# Patient Record
Sex: Male | Born: 1990 | Race: White | Hispanic: No | Marital: Single | Smoking: Never smoker
Health system: Southern US, Community
[De-identification: ages and names within clinical notes are randomized; demographics above are authoritative.]

## PROBLEM LIST (undated history)

## (undated) HISTORY — PX: ANTERIOR CRUCIATE LIGAMENT REPAIR: SHX115

## (undated) HISTORY — PX: ADENOIDECTOMY: SUR15

---
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
## 2016-06-08 ENCOUNTER — Encounter (HOSPITAL_BASED_OUTPATIENT_CLINIC_OR_DEPARTMENT_OTHER): Payer: Self-pay | Admitting: Emergency Medicine

## 2016-06-08 ENCOUNTER — Emergency Department (HOSPITAL_BASED_OUTPATIENT_CLINIC_OR_DEPARTMENT_OTHER)
Admission: EM | Admit: 2016-06-08 | Discharge: 2016-06-08 | Disposition: A | Discharge status: Home / Self Care | Payer: BLUE CROSS/BLUE SHIELD | Attending: Emergency Medicine | Admitting: Emergency Medicine

## 2016-06-08 DIAGNOSIS — Z791 Long term (current) use of non-steroidal anti-inflammatories (NSAID): Secondary | ICD-10-CM | POA: Diagnosis not present

## 2016-06-08 DIAGNOSIS — K121 Other forms of stomatitis: Secondary | ICD-10-CM | POA: Diagnosis not present

## 2016-06-08 DIAGNOSIS — R509 Fever, unspecified: Secondary | ICD-10-CM | POA: Diagnosis not present

## 2016-06-08 DIAGNOSIS — Z79899 Other long term (current) drug therapy: Secondary | ICD-10-CM | POA: Insufficient documentation

## 2016-06-08 DIAGNOSIS — J029 Acute pharyngitis, unspecified: Secondary | ICD-10-CM | POA: Diagnosis present

## 2016-06-08 MED ORDER — OXYCODONE HCL 5 MG/5ML PO SOLN: 5.0000 mg | ORAL | 0 refills | Status: DC | PRN

## 2016-06-08 NOTE — ED Triage Notes (Signed)
Patient reports for the past 10 days he has had a sore throat. He had a fever initially, but that has resolved. Patient states he had sore in his mouth that have resolved, but now there is patches of pus on his mouth. Patient was [facility] by [facility], was given prednisone and "first mouthwash". Patient face is swollen. Patient is able to speak in complete sentences without difficulties.

## 2016-06-08 NOTE — Discharge Instructions (Signed)
Follow up with your family doc.  Return if unable to swallow.

## 2016-06-08 NOTE — ED Provider Notes (Signed)
[facility] Provider Note   CSN: [id number] Arrival date & time: 06/08/16  1804 By signing my name below, I, Randall Romero, attest that this documentation has been prepared under the direction and in the presence of Randall Plan, DO . Electronically Signed: Levon Romero, Scribe. 06/08/2016. 7:10 PM.   History   Chief Complaint Chief Complaint  Patient presents with  . Sore Throat    HPI Randall Romero is a 25 y.o. male who presents to the Emergency Department complaining of constant sore throat onset 10 days ago which significantly worsened yesterday. Pain is exacerbated by swallowing and eating. Per pt, he woke up two days ago with mouth sores. He states the sores blistered yesterday and have since begun to drain pus. Pt also notes associated fever which has since resolved, facial swelling, and cough secondary to drainage. He is tolerating secretions. [facility] and was given prednisone and First mouthwash which he has taken with no relief. No sick contact. Immunizations UTD. Pt denies any rash to his extremities, nausea, vomiting or any other symptoms.   The history is provided by the patient and a parent. No language interpreter was used.   History reviewed. No pertinent past medical history.  There are no active problems to display for this patient.   Past Surgical History:  Procedure Laterality Date  . ADENOIDECTOMY    . ANTERIOR CRUCIATE LIGAMENT REPAIR Right     Home Medications    Prior to Admission medications   Medication Sig Start Date End Date Taking? Authorizing Provider  DPH-Lido-AlHydr-MgHydr-Simeth [facility] BLM) SUSP Use as directed in the mouth or throat.   Yes Historical Provider, MD  ibuprofen (ADVIL,MOTRIN) 200 MG tablet Take 600 mg by mouth once.   Yes Historical Provider, MD  predniSONE (DELTASONE) 20 MG tablet Take 20 mg by mouth.   Yes Historical Provider, MD  Pseudoephedrine-APAP-DM (DAYQUIL PO) Take by  mouth.   Yes Historical Provider, MD  oxyCODONE (ROXICODONE) 5 MG/5ML solution Take 5 mLs (5 mg total) by mouth every 4 (four) hours as needed for severe pain. 06/08/16   Randall Plan, DO    Family History History reviewed. No pertinent family history.  Social History Social History  Substance Use Topics  . Smoking status: Never Smoker  . Smokeless tobacco: Never Used  . Alcohol use No     Comment: social     Allergies   Patient has no known allergies.   Review of Systems Review of Systems  Constitutional: Positive for fever (resolved). Negative for chills.  HENT: Positive for facial swelling, mouth sores and sore throat. Negative for congestion.   Eyes: Negative for discharge and visual disturbance.  Respiratory: Negative for shortness of breath.   Cardiovascular: Negative for chest pain and palpitations.  Gastrointestinal: Negative for abdominal pain, diarrhea, nausea and vomiting.  Musculoskeletal: Negative for arthralgias and myalgias.  Skin: Negative for color change and rash.  Neurological: Negative for tremors, syncope and headaches.  Psychiatric/Behavioral: Negative for confusion and dysphoric mood.   Physical Exam Updated Vital Signs BP 127/87 (BP Location: Left Arm)   Pulse 81   Temp 98.9 F (37.2 C) (Oral)   Resp 18   Ht 5\' 11"  (1.803 m)   Wt 171 lb 8 oz (77.8 kg)   SpO2 99%   BMI 23.92 kg/m   Physical Exam  Constitutional: He is oriented to person, place, and time. He appears well-developed and well-nourished.  HENT:  Head: Normocephalic and atraumatic.  Right Ear: Tympanic membrane normal.  Left Ear: Tympanic membrane normal.  Eroded ulcerations along the oropharynx. No swelling of the parotid gland. No signs of dental caries.    Eyes: EOM are normal. Pupils are equal, round, and reactive to light.  Neck: Normal range of motion. Neck supple. No JVD present.  Cardiovascular: Normal rate and regular rhythm.  Exam reveals no gallop and no friction rub.     No murmur heard. Pulmonary/Chest: No respiratory distress. He has no wheezes.  Abdominal: He exhibits no distension. There is no rebound and no guarding.  Musculoskeletal: Normal range of motion.  Neurological: He is alert and oriented to person, place, and time.  Skin: No rash noted. No pallor.  No rash to bilateral hands.   Psychiatric: He has a normal mood and affect. His behavior is normal.  Nursing note and vitals reviewed.  ED Treatments / Results  DIAGNOSTIC STUDIES:  Oxygen Saturation is 99% on RA, normal by my interpretation.    COORDINATION OF CARE:  7:03 PM Discussed treatment Romero with pt at bedside and pt agreed to Romero.   Labs (all labs ordered are listed, but only abnormal results are displayed) Labs Reviewed - No data to display  EKG  EKG Interpretation None       Radiology No results found.  Procedures Procedures (including critical care time)  Medications Ordered in ED Medications - No data to display   Initial Impression / Assessment and Romero / ED Course  I have reviewed the triage vital signs and the nursing notes.  Pertinent labs & imaging results that were available during my care of the patient were reviewed by me and considered in my medical decision making (see chart for details).  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 They going on for the past couple days. Has Magic mouthwash at home. Family is concerned because of the severity of symptoms. Patient appears well hydrated. Multiple eroded ulcerations to the gingiva in the posterior oropharynx. Tolerating his secretions well. No noted parotid tenderness. No lesions to the palms or soles. Discussed symptomatic therapy with the family. Offered HIV testing and declined. PCP follow-up.  11:19 PM:  I have discussed the diagnosis/risks/treatment options with the patient and family and believe the pt to be eligible for discharge home to follow-up with PCP. We also  discussed returning to the ED immediately if new or worsening sx occur. We discussed the sx which are most concerning (e.g., sudden worsening pain, fever, inability to tolerate by mouth) that necessitate immediate return. Medications administered to the patient during their visit and any new prescriptions provided to the patient are listed below.  Medications given during this visit Medications - No data to display   The patient appears reasonably screen and/or stabilized for discharge and I doubt any other medical condition or other [facility] requiring further screening, evaluation, or treatment in the ED at this time prior to discharge.    Final Clinical Impressions(s) / ED Diagnoses   Final diagnoses:  Stomatitis    New Prescriptions Discharge Medication List as of 06/08/2016  7:10 PM    START taking these medications   Details  oxyCODONE (ROXICODONE) 5 MG/5ML solution Take 5 mLs (5 mg total) by mouth every 4 (four) hours as needed for severe pain., Starting Wed 06/08/2016, Print       I personally performed the services described in this documentation, which was scribed in my presence. The recorded  information has been reviewed and is accurate.     Randall Planan Sharline Lehane, DO 06/08/16 2319

## 2016-06-09 ENCOUNTER — Ambulatory Visit: Payer: PRIVATE HEALTH INSURANCE | Admitting: Family Medicine

## 2016-06-11 ENCOUNTER — Encounter (HOSPITAL_BASED_OUTPATIENT_CLINIC_OR_DEPARTMENT_OTHER): Payer: Self-pay | Admitting: Emergency Medicine

## 2016-06-11 ENCOUNTER — Emergency Department (HOSPITAL_BASED_OUTPATIENT_CLINIC_OR_DEPARTMENT_OTHER)
Admission: EM | Admit: 2016-06-11 | Discharge: 2016-06-11 | Disposition: A | Discharge status: Home / Self Care | Payer: BLUE CROSS/BLUE SHIELD | Source: Home / Self Care | Attending: Emergency Medicine | Admitting: Emergency Medicine

## 2016-06-11 DIAGNOSIS — B002 Herpesviral gingivostomatitis and pharyngotonsillitis: Secondary | ICD-10-CM | POA: Diagnosis not present

## 2016-06-11 DIAGNOSIS — K120 Recurrent oral aphthae: Secondary | ICD-10-CM | POA: Insufficient documentation

## 2016-06-11 DIAGNOSIS — E860 Dehydration: Secondary | ICD-10-CM | POA: Diagnosis not present

## 2016-06-11 MED ORDER — SODIUM CHLORIDE 0.9 % IV BOLUS (SEPSIS)
1000.0000 mL | Freq: Once | INTRAVENOUS | Status: AC
  Administered 2016-06-11: 1000 mL via INTRAVENOUS

## 2016-06-11 MED ORDER — KETOROLAC TROMETHAMINE 15 MG/ML IJ SOLN
15.0000 mg | Freq: Once | INTRAMUSCULAR | Status: AC
  Administered 2016-06-11: 15 mg via INTRAVENOUS
  Filled 2016-06-11: qty 1

## 2016-06-11 MED ORDER — SUCRALFATE 1 GM/10ML PO SUSP: 1.0000 g | Freq: Three times a day (TID) | ORAL | 0 refills | Status: DC

## 2016-06-11 MED ORDER — MAGIC MOUTHWASH W/LIDOCAINE: 5.0000 mL | Freq: Four times a day (QID) | ORAL | 1 refills | Status: AC | PRN

## 2016-06-11 NOTE — ED Triage Notes (Signed)
Pt c/o sore throat, unable to swallow or drink water; seen here Wed for same

## 2016-06-11 NOTE — ED Provider Notes (Signed)
[facility] Provider Note   CSN: [id number] Arrival date & time: 06/11/16  [id number]     History   Chief Complaint Chief Complaint  Patient presents with  . Sore Throat    HPI Randall Romero is a 25 y.o. male.   Sore Throat    Pt comes in with cc of oral pain. Pt started feeling unwell about 5-6 days ago. On Wed he was seen in the ER, pt was having subjective fevers, and he was found to have oral ulcers. Pt [facility] [employer] [employer] [facility] [facility] prior to ER arrival, and was started on prednisone, mouth wash. Dr. Adela LankFloyd here had started pt on oral pain meds - but pt still has pan with swallowing, and his pain now affects the throat. Pt feels dehydrated. PT is taking ibuprofen with transient relief.  Pt denies any hx of STD, any high risk sexual encounter, any genital lesions, and autoimmune condition that runs in the family. Pt declined to be tested for HIV. Pt has pink eye on the L side, he thinks the vision is little blurry there. + tearing. Pt has some other URI like symptoms. No rash to the hands. Pt has no PCP.  History reviewed. No pertinent past medical history.  There are no active problems to display for this patient.   Past Surgical History:  Procedure Laterality Date  . ADENOIDECTOMY    . ANTERIOR CRUCIATE LIGAMENT REPAIR Right        Home Medications    Prior to Admission medications   Medication Sig Start Date End Date Taking? Authorizing Provider  DPH-Lido-AlHydr-MgHydr-Simeth [facility](FIRST-MOUTHWASH BLM) SUSP Use as directed in the mouth or throat.    Historical Provider, MD  ibuprofen (ADVIL,MOTRIN) 200 MG tablet Take 600 mg by mouth once.    Historical Provider, MD  oxyCODONE (ROXICODONE) 5 MG/5ML solution Take 5 mLs (5 mg total) by mouth every 4 (four) hours as needed for severe pain. 06/08/16   Melene Planan Floyd, DO  predniSONE (DELTASONE) 20 MG tablet Take 20 mg by mouth.    Historical Provider, MD  Pseudoephedrine-APAP-DM (DAYQUIL PO) Take by mouth.     Historical Provider, MD    Family History History reviewed. No pertinent family history.  Social History Social History  Substance Use Topics  . Smoking status: Never Smoker  . Smokeless tobacco: Never Used  . Alcohol use No     Comment: social     Allergies   Patient has no known allergies.   Review of Systems Review of Systems  ROS 10 Systems reviewed and are negative for acute change except as noted in the HPI.     Physical Exam Updated Vital Signs BP 136/90   Pulse 75   Temp 98.8 F (37.1 C) (Oral)   Resp 16   SpO2 100%   Physical Exam  Constitutional: He is oriented to person, place, and time. He appears well-developed.  HENT:  Head: Normocephalic and atraumatic.  Pt has excoriation of the lips, and of the oral mucosa, particularly the hard palate. The peritonsillar region has some cream color coating.  Pt has no trismus, stridor or crepitus over the neck. Pt has no tonsillar enlargement and exudates. Pharynx is erythematous. Base of the mouth is normal   Eyes: EOM are normal. Pupils are equal, round, and reactive to light.  Pink eye right side  Neck: Normal range of motion. Neck supple.  Cardiovascular: Normal rate and regular rhythm.   Pulmonary/Chest: Effort normal and breath sounds  normal.  Abdominal: Soft. Bowel sounds are normal.  Neurological: He is alert and oriented to person, place, and time.  Skin: Skin is warm.  Nursing note and vitals reviewed.    ED Treatments / Results  Labs (all labs ordered are listed, but only abnormal results are displayed) Labs Reviewed - No data to display  EKG  EKG Interpretation None       Radiology No results found.  Procedures Procedures (including critical care time)  Medications Ordered in ED Medications  sodium chloride 0.9 % bolus 1,000 mL (0 mLs Intravenous Stopped 06/11/16 1153)  ketorolac (TORADOL) 15 MG/ML injection 15 mg (15 mg Intravenous Given 06/11/16 1108)     Initial  Impression / Assessment and Plan / ED Course  I have reviewed the triage vital signs and the nursing notes.  Pertinent labs & imaging results that were available during my care of the patient were reviewed by me and considered in my medical decision making (see chart for details).  Clinical Course as of Jun 12 1243  Sat Jun 11, 2016  1237 Post iv fluids. Toradol helped, pain still not completely resolved.  Strict ER return precautions have been discussed, and patient is agreeing with the plan and is comfortable with the workup done and the recommendations from the ER.   [AN]  1242 Right eye 20/40 Left eye: 20/25 Binocular: 20/20  [AN]    Clinical Course User Index [AN] Derwood KaplanAnkit Tadarrius Burch, MD    Pt comes in with mouth pain. Pt is on appropriate meds - steroids/nsaids/norco/magic mouth wash. We will hydrate, as he feels dry. Throat exam reveals ?muco-purulent tissue vs. Granulation tissue over the healing ulcers.  ROS is neg for other system involvement - except the eye is red and ? Blurry vision. No pain over the eye. ROS and family hx is reassuring.  DDX: Apthous ulcer, HSV -1, HIV, auto-immune condition like Behcet's (due to eye involvement), and vasculitis are possible. [facility] student [facility] [employer], finished finals -so could be stress related. Doubt [facility]. No new meds or exposures prior to the symptoms.  We will give pt PCP f/u here. Continue with sx management. GI info will be given, but pt advised to call them only if the symptoms worsening and had gone more than 2 weeks.  Carafate added.  Final Clinical Impressions(s) / ED Diagnoses   Final diagnoses:  Oral aphthous ulcer    New Prescriptions New Prescriptions   No medications on file     Derwood KaplanAnkit Judianne Seiple, MD 06/11/16 1244

## 2016-06-11 NOTE — Discharge Instructions (Signed)
Follow up with one of the primary doctors that we have requested. Gi DOCTOR FOLLOW UP ONLY if not getting better beyond 2 weeks.  Please return to the ER if your symptoms worsen; you have increased pain, fevers, chills, inability to keep any medications down, spreading of the rash, skin sloughing off. Otherwise see the outpatient doctor as requested.

## 2016-06-12 ENCOUNTER — Inpatient Hospital Stay (HOSPITAL_BASED_OUTPATIENT_CLINIC_OR_DEPARTMENT_OTHER)
Admission: EM | Admit: 2016-06-12 | Discharge: 2016-06-16 | DRG: 159 | Disposition: A | Discharge status: Home / Self Care | Payer: BLUE CROSS/BLUE SHIELD | Attending: Family Medicine | Admitting: Family Medicine

## 2016-06-12 ENCOUNTER — Emergency Department (HOSPITAL_BASED_OUTPATIENT_CLINIC_OR_DEPARTMENT_OTHER): Payer: BLUE CROSS/BLUE SHIELD

## 2016-06-12 ENCOUNTER — Encounter (HOSPITAL_BASED_OUTPATIENT_CLINIC_OR_DEPARTMENT_OTHER): Payer: Self-pay | Admitting: Emergency Medicine

## 2016-06-12 DIAGNOSIS — Z79891 Long term (current) use of opiate analgesic: Secondary | ICD-10-CM

## 2016-06-12 DIAGNOSIS — J040 Acute laryngitis: Secondary | ICD-10-CM | POA: Diagnosis present

## 2016-06-12 DIAGNOSIS — R509 Fever, unspecified: Secondary | ICD-10-CM

## 2016-06-12 DIAGNOSIS — R0682 Tachypnea, not elsewhere classified: Secondary | ICD-10-CM | POA: Diagnosis present

## 2016-06-12 DIAGNOSIS — H538 Other visual disturbances: Secondary | ICD-10-CM | POA: Diagnosis present

## 2016-06-12 DIAGNOSIS — R000 Tachycardia, unspecified: Secondary | ICD-10-CM | POA: Diagnosis present

## 2016-06-12 DIAGNOSIS — J029 Acute pharyngitis, unspecified: Secondary | ICD-10-CM

## 2016-06-12 DIAGNOSIS — B002 Herpesviral gingivostomatitis and pharyngotonsillitis: Secondary | ICD-10-CM

## 2016-06-12 DIAGNOSIS — K121 Other forms of stomatitis: Secondary | ICD-10-CM

## 2016-06-12 DIAGNOSIS — E860 Dehydration: Secondary | ICD-10-CM | POA: Diagnosis present

## 2016-06-12 DIAGNOSIS — B9789 Other viral agents as the cause of diseases classified elsewhere: Secondary | ICD-10-CM | POA: Diagnosis present

## 2016-06-12 DIAGNOSIS — Z7952 Long term (current) use of systemic steroids: Secondary | ICD-10-CM

## 2016-06-12 DIAGNOSIS — B309 Viral conjunctivitis, unspecified: Secondary | ICD-10-CM | POA: Diagnosis present

## 2016-06-12 LAB — COMPREHENSIVE METABOLIC PANEL
ALK PHOS: 35 U/L — AB (ref 38–126)
ALT: 35 U/L (ref 17–63)
AST: 23 U/L (ref 15–41)
Albumin: 3.8 g/dL (ref 3.5–5.0)
Anion gap: 11 (ref 5–15)
BUN: 19 mg/dL (ref 6–20)
CALCIUM: 9.2 mg/dL (ref 8.9–10.3)
CO2: 26 mmol/L (ref 22–32)
CREATININE: 0.93 mg/dL (ref 0.61–1.24)
Chloride: 103 mmol/L (ref 101–111)
Glucose, Bld: 96 mg/dL (ref 65–99)
Potassium: 3.8 mmol/L (ref 3.5–5.1)
Sodium: 140 mmol/L (ref 135–145)
TOTAL PROTEIN: 7.6 g/dL (ref 6.5–8.1)
Total Bilirubin: 0.5 mg/dL (ref 0.3–1.2)

## 2016-06-12 LAB — MONONUCLEOSIS SCREEN: MONO SCREEN: NEGATIVE

## 2016-06-12 LAB — CBC WITH DIFFERENTIAL/PLATELET
Basophils Absolute: 0 10*3/uL (ref 0.0–0.1)
Basophils Relative: 0 %
EOS PCT: 2 %
Eosinophils Absolute: 0.2 10*3/uL (ref 0.0–0.7)
HCT: 43.1 % (ref 39.0–52.0)
HEMOGLOBIN: 15 g/dL (ref 13.0–17.0)
LYMPHS ABS: 1.9 10*3/uL (ref 0.7–4.0)
LYMPHS PCT: 20 %
MCH: 31 pg (ref 26.0–34.0)
MCHC: 34.8 g/dL (ref 30.0–36.0)
MCV: 89 fL (ref 78.0–100.0)
Monocytes Absolute: 1.1 10*3/uL — ABNORMAL HIGH (ref 0.1–1.0)
Monocytes Relative: 12 %
Neutro Abs: 6 10*3/uL (ref 1.7–7.7)
Neutrophils Relative %: 66 %
PLATELETS: 330 10*3/uL (ref 150–400)
RBC: 4.84 MIL/uL (ref 4.22–5.81)
RDW: 11.6 % (ref 11.5–15.5)
WBC: 9.1 10*3/uL (ref 4.0–10.5)

## 2016-06-12 LAB — RAPID STREP SCREEN (MED CTR MEBANE ONLY): Streptococcus, Group A Screen (Direct): NEGATIVE

## 2016-06-12 LAB — I-STAT CG4 LACTIC ACID, ED: LACTIC ACID, VENOUS: 0.97 mmol/L (ref 0.5–1.9)

## 2016-06-12 MED ORDER — HYDROMORPHONE HCL 1 MG/ML IJ SOLN
1.0000 mg | Freq: Once | INTRAMUSCULAR | Status: AC
  Administered 2016-06-12: 1 mg via INTRAVENOUS
  Filled 2016-06-12: qty 1

## 2016-06-12 MED ORDER — IBUPROFEN 800 MG PO TABS
800.0000 mg | ORAL_TABLET | Freq: Once | ORAL | Status: DC
  Filled 2016-06-12: qty 1

## 2016-06-12 MED ORDER — IBUPROFEN 100 MG/5ML PO SUSP
800.0000 mg | Freq: Once | ORAL | Status: AC
  Administered 2016-06-12: 800 mg via ORAL
  Filled 2016-06-12: qty 40

## 2016-06-12 MED ORDER — ACETAMINOPHEN 500 MG PO TABS
1000.0000 mg | ORAL_TABLET | Freq: Once | ORAL | Status: DC
  Filled 2016-06-12: qty 2

## 2016-06-12 MED ORDER — SODIUM CHLORIDE 0.9 % IV BOLUS (SEPSIS)
1000.0000 mL | Freq: Once | INTRAVENOUS | Status: AC
  Administered 2016-06-12: 1000 mL via INTRAVENOUS

## 2016-06-12 MED ORDER — MORPHINE SULFATE (PF) 4 MG/ML IV SOLN
4.0000 mg | Freq: Once | INTRAVENOUS | Status: AC
  Administered 2016-06-12: 4 mg via INTRAVENOUS
  Filled 2016-06-12: qty 1

## 2016-06-12 MED ORDER — SODIUM CHLORIDE 0.9 % IV BOLUS (SEPSIS)
500.0000 mL | Freq: Once | INTRAVENOUS | Status: AC
  Administered 2016-06-13: 500 mL via INTRAVENOUS

## 2016-06-12 MED ORDER — IOPAMIDOL (ISOVUE-300) INJECTION 61%
100.0000 mL | Freq: Once | INTRAVENOUS | Status: AC | PRN
  Administered 2016-06-12: 100 mL via INTRAVENOUS

## 2016-06-12 MED ORDER — ONDANSETRON HCL 4 MG/2ML IJ SOLN
4.0000 mg | Freq: Once | INTRAMUSCULAR | Status: AC
  Administered 2016-06-12: 4 mg via INTRAVENOUS
  Filled 2016-06-12: qty 2

## 2016-06-12 MED ORDER — ACETAMINOPHEN 160 MG/5ML PO SOLN
1000.0000 mg | Freq: Once | ORAL | Status: AC
  Administered 2016-06-12: 1000 mg via ORAL
  Filled 2016-06-12: qty 40.6

## 2016-06-12 MED ORDER — CLINDAMYCIN PHOSPHATE 900 MG/50ML IV SOLN
900.0000 mg | Freq: Once | INTRAVENOUS | Status: AC
  Administered 2016-06-12: 900 mg via INTRAVENOUS
  Filled 2016-06-12: qty 50

## 2016-06-12 NOTE — ED Notes (Signed)
EDP into room 

## 2016-06-12 NOTE — ED Provider Notes (Signed)
[facility]   CSN: [id number] Arrival date & time: 06/12/16  2141  By signing my name below, I, Arianna Nassar and Talbert Nanaul Grant, attest that this documentation has been prepared under the direction and in the presence of Jacalyn LefevreJulie Clorene Nerio, MD.  Electronically Signed: Octavia HeirArianna Nassar, ED Scribe. 06/12/16. 10:02 PM.   History   Chief Complaint Chief Complaint  Patient presents with  . Fever    HPI Randall Romero is a 25 y.o. male who presents to the Emergency Department complaining of gradual onset, gradual worsening sore throat that started one week ago. He states associated fever (tmax 103.1) and chills. He expresses increased pain with swallowing and is unable to keep fluids down. Pt [facility] where he was swabbed for thrush in which the results came back negative. He was further started on prednisone and given a mouthwash which did not give him any relief. [facility] and 12/16 for the same symptoms. He received oxycodone and diagnosed with an oral aphthous ulcer. According to pt's mother drank yogurt and mashed potatoes earlier today. Pt reports that he took ibuprofen (800 mg) x4 at 2000 tonight without relief. Mother denies diarrhea or abdominal pain.   The history is provided by the patient and a parent. No language interpreter was used.    History reviewed. No pertinent past medical history.  Patient Active Problem List   Diagnosis Date Noted  . Pharyngitis 06/13/2016    Past Surgical History:  Procedure Laterality Date  . ADENOIDECTOMY    . ANTERIOR CRUCIATE LIGAMENT REPAIR Right        Home Medications    Prior to Admission medications   Medication Sig Start Date End Date Taking? Authorizing Provider  DPH-Lido-AlHydr-MgHydr-Simeth [facility](FIRST-MOUTHWASH BLM) SUSP Use as directed in the mouth or throat.    Historical Provider, MD  ibuprofen (ADVIL,MOTRIN) 200 MG tablet Take 600 mg by mouth  once.    Historical Provider, MD  magic mouthwash w/lidocaine SOLN Take 5 mLs by mouth 4 (four) times daily as needed for mouth pain. 06/11/16   Derwood KaplanAnkit Nanavati, MD  oxyCODONE (ROXICODONE) 5 MG/5ML solution Take 5 mLs (5 mg total) by mouth every 4 (four) hours as needed for severe pain. 06/08/16   Melene Planan Floyd, DO  predniSONE (DELTASONE) 20 MG tablet Take 20 mg by mouth.    Historical Provider, MD  Pseudoephedrine-APAP-DM (DAYQUIL PO) Take by mouth.    Historical Provider, MD  sucralfate (CARAFATE) 1 GM/10ML suspension Take 10 mLs (1 g total) by mouth 4 (four) times daily -  with meals and at bedtime. 06/11/16   Derwood KaplanAnkit Nanavati, MD    Family History History reviewed. No pertinent family history.  Social History Social History  Substance Use Topics  . Smoking status: Never Smoker  . Smokeless tobacco: Never Used  . Alcohol use No     Comment: social     Allergies   Patient has no known allergies.   Review of Systems Review of Systems   A complete 10 system review of systems was obtained and all systems are negative except as noted in the HPI and PMH.    Physical Exam Updated Vital Signs BP 120/78   Pulse 78   Temp 98.1 F (36.7 C) (Oral)   Resp 18   Ht 5\' 11"  (1.803 m)   Wt 164 lb 2 oz (74.4 kg)   SpO2 100%   BMI 22.89  kg/m   Physical Exam  Constitutional: He is oriented to person, place, and time. He appears well-developed and well-nourished.  Appears ill and uncomfortable.  HENT:  Head: Normocephalic and atraumatic.  Mouth/Throat: Mucous membranes are dry.  Oral ulcers. erythematous oropharanyx.   Pt has excoriation of the lips, and of the oral mucosa, particularly the hard palate.   Eyes: EOM are normal.  Neck: Normal range of motion.  Cervical lymphadenopathy.  Cardiovascular: Regular rhythm, normal heart sounds and intact distal pulses.  Tachycardia present.   Tachycardia.  Pulmonary/Chest: Effort normal and breath sounds normal. Tachypnea noted. No  respiratory distress.  Tachypneic.  Abdominal: Soft. He exhibits no distension. There is no tenderness.  Musculoskeletal: Normal range of motion.  Neurological: He is alert and oriented to person, place, and time.  Skin: Skin is warm and dry.  Psychiatric: He has a normal mood and affect. Judgment normal.  Nursing note and vitals reviewed.    ED Treatments / Results   DIAGNOSTIC STUDIES: Oxygen Saturation is 97% on room air, adequate by my interpretation.  COORDINATION OF CARE: 10:02 PM Discussed treatment plan with pt at bedside and pt agreed to plan.   Labs (all labs ordered are listed, but only abnormal results are displayed) Labs Reviewed  CBC WITH DIFFERENTIAL/PLATELET - Abnormal; Notable for the following:       Result Value   Monocytes Absolute 1.1 (*)    All other components within normal limits  COMPREHENSIVE METABOLIC PANEL - Abnormal; Notable for the following:    Alkaline Phosphatase 35 (*)    All other components within normal limits  RAPID STREP SCREEN (NOT AT [facility])  CULTURE, BLOOD (ROUTINE X 2)  CULTURE, BLOOD (ROUTINE X 2)  CULTURE, GROUP A STREP ([facility])  MONONUCLEOSIS SCREEN  I-STAT CG4 LACTIC ACID, ED    EKG  EKG Interpretation None       Radiology Ct Soft Tissue Neck W Contrast  Result Date: 06/12/2016 CLINICAL DATA:  Gradual onset sore throat for 1 week. Fever, difficulty swallowing. Evaluate pharyngitis. History of adenoidectomy. EXAM: CT NECK WITH CONTRAST TECHNIQUE: Multidetector CT imaging of the neck was performed using the standard protocol following the bolus administration of intravenous contrast. CONTRAST:  ISOVUE-300 IOPAMIDOL (ISOVUE-300) INJECTION 61% COMPARISON:  None. FINDINGS: Pharynx and larynx: Trace retropharyngeal effusion. Mildly edematous hypopharynx, normal larynx. Airway is widely patent. Salivary glands: Normal. Thyroid: Normal. Lymph nodes: 11 mm short access level IIa lymph node, additional smaller scattered lymph  nodes. Vascular: 2 vessel aortic arch is a normal variant. Limited intracranial: Normal. Visualized orbits: Normal. Mastoids and visualized paranasal sinuses: Trace mucosal thickening without paranasal sinus air-fluid levels. Mastoid air cells are well aerated. Skeleton: Straightened cervical lordosis. No destructive bony lesions. Upper chest: Trace bronchial wall thickening in the included lung apices associated with bronchitis and reactive airway disease. Other: None. IMPRESSION: Mild pharyngitis and trace retropharyngeal effusion. Borderline reactive lymphadenopathy. Electronically Signed   By: Awilda Metro M.D.   On: 06/12/2016 23:48    Procedures Procedures (including critical care time)  Medications Ordered in ED Medications  sodium chloride 0.9 % bolus 1,000 mL (1,000 mLs Intravenous New Bag/Given 06/12/16 2235)  sodium chloride 0.9 % bolus 1,000 mL (1,000 mLs Intravenous New Bag/Given 06/12/16 2235)    And  sodium chloride 0.9 % bolus 500 mL (500 mLs Intravenous New Bag/Given 06/13/16 0003)  clindamycin (CLEOCIN) IVPB 900 mg (0 mg Intravenous Stopped 06/12/16 2310)  morphine 4 MG/ML injection 4 mg (4 mg Intravenous Given 06/12/16  2235)  ondansetron (ZOFRAN) injection 4 mg (4 mg Intravenous Given 06/12/16 2234)  iopamidol (ISOVUE-300) 61 % injection 100 mL (100 mLs Intravenous Contrast Given 06/12/16 2257)  acetaminophen (TYLENOL) solution 1,000 mg (1,000 mg Oral Given 06/12/16 2243)  ibuprofen (ADVIL,MOTRIN) 100 MG/5ML suspension 800 mg (800 mg Oral Given 06/12/16 2245)  HYDROmorphone (DILAUDID) injection 1 mg (1 mg Intravenous Given 06/12/16 2313)     Initial Impression / Assessment and Plan / ED Course  I have reviewed the triage vital signs and the nursing notes.  Pertinent labs & imaging results that were available during my care of the patient were reviewed by me and considered in my medical decision making (see chart for details).  Clinical Course    Pt is not  improving and has been to the ED multiple times and his throat still looks terrible.  His fever has improved with tylenol and ibuprofen.  HR improved with fever and fluids.  BP is normal.  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  The pt given 2500 cc NS.   I called a code sepsis, but it is unlikely that pt is septic.  Pt given a dose of clinda here in the ED.  Pt d/w Dr. Toniann FailKakrakandy who [facility]  Pt is stable for transfer.   Final Clinical Impressions(s) / ED Diagnoses   Final diagnoses:  Herpes stomatitis  Pharyngitis, unspecified etiology  Fever, unspecified fever cause  Dehydration   I personally performed the services described in this documentation, which was scribed in my presence. The recorded information has been reviewed and is accurate.    New Prescriptions New Prescriptions   No medications on file     Jacalyn LefevreJulie Genny Caulder, MD 06/13/16 0021

## 2016-06-12 NOTE — ED Triage Notes (Signed)
Pt c/o sore throat, unable to swallow or drink water; seen here Wed and yesterday for the same - motrin at 1500  - Patient has oxycodone at 8 pm  - patient reports that his fever is worse today and he has started to have chills and shivers

## 2016-06-13 ENCOUNTER — Encounter (HOSPITAL_COMMUNITY): Payer: Self-pay | Admitting: *Deleted

## 2016-06-13 DIAGNOSIS — H538 Other visual disturbances: Secondary | ICD-10-CM | POA: Diagnosis not present

## 2016-06-13 DIAGNOSIS — B9789 Other viral agents as the cause of diseases classified elsewhere: Secondary | ICD-10-CM | POA: Diagnosis not present

## 2016-06-13 DIAGNOSIS — R0682 Tachypnea, not elsewhere classified: Secondary | ICD-10-CM | POA: Diagnosis present

## 2016-06-13 DIAGNOSIS — Z79891 Long term (current) use of opiate analgesic: Secondary | ICD-10-CM | POA: Diagnosis not present

## 2016-06-13 DIAGNOSIS — J040 Acute laryngitis: Secondary | ICD-10-CM | POA: Diagnosis present

## 2016-06-13 DIAGNOSIS — B002 Herpesviral gingivostomatitis and pharyngotonsillitis: Secondary | ICD-10-CM | POA: Diagnosis present

## 2016-06-13 DIAGNOSIS — E860 Dehydration: Secondary | ICD-10-CM | POA: Diagnosis present

## 2016-06-13 DIAGNOSIS — K121 Other forms of stomatitis: Secondary | ICD-10-CM | POA: Diagnosis not present

## 2016-06-13 DIAGNOSIS — J029 Acute pharyngitis, unspecified: Secondary | ICD-10-CM | POA: Diagnosis not present

## 2016-06-13 DIAGNOSIS — Z7952 Long term (current) use of systemic steroids: Secondary | ICD-10-CM | POA: Diagnosis not present

## 2016-06-13 DIAGNOSIS — R000 Tachycardia, unspecified: Secondary | ICD-10-CM | POA: Diagnosis present

## 2016-06-13 DIAGNOSIS — B309 Viral conjunctivitis, unspecified: Secondary | ICD-10-CM | POA: Diagnosis present

## 2016-06-13 MED ORDER — PREDNISONE 20 MG PO TABS: 20.0000 mg | ORAL_TABLET | Freq: Every day | ORAL | Status: DC

## 2016-06-13 MED ORDER — DEXTROSE 5 % IV SOLN
5.0000 mg/kg | Freq: Three times a day (TID) | INTRAVENOUS | Status: DC
  Administered 2016-06-13 – 2016-06-14 (×4): 370 mg via INTRAVENOUS
  Filled 2016-06-13 (×5): qty 7.4

## 2016-06-13 MED ORDER — DOCUSATE SODIUM 50 MG/5ML PO LIQD
200.0000 mg | Freq: Two times a day (BID) | ORAL | Status: DC
  Administered 2016-06-13 – 2016-06-14 (×2): 200 mg via ORAL
  Filled 2016-06-13 (×3): qty 20

## 2016-06-13 MED ORDER — DEXTROSE 5 % IV SOLN
10.0000 mg/kg | Freq: Three times a day (TID) | INTRAVENOUS | Status: DC
  Filled 2016-06-13 (×2): qty 14.9

## 2016-06-13 MED ORDER — ACETAMINOPHEN 160 MG/5ML PO SOLN
650.0000 mg | ORAL | Status: DC | PRN
  Administered 2016-06-13 – 2016-06-14 (×5): 650 mg via ORAL
  Filled 2016-06-13 (×5): qty 20.3

## 2016-06-13 MED ORDER — SODIUM CHLORIDE 0.9 % IV SOLN
INTRAVENOUS | Status: DC
  Administered 2016-06-13 – 2016-06-15 (×6): via INTRAVENOUS

## 2016-06-13 MED ORDER — IBUPROFEN 600 MG PO TABS
600.0000 mg | ORAL_TABLET | Freq: Once | ORAL | Status: AC
  Administered 2016-06-13: 600 mg via ORAL
  Filled 2016-06-13: qty 1

## 2016-06-13 MED ORDER — DEXTROSE 5 % IV SOLN
5.0000 mg/kg | Freq: Three times a day (TID) | INTRAVENOUS | Status: DC
  Filled 2016-06-13 (×2): qty 7.4

## 2016-06-13 MED ORDER — ENOXAPARIN SODIUM 40 MG/0.4ML ~~LOC~~ SOLN
40.0000 mg | SUBCUTANEOUS | Status: DC
  Filled 2016-06-13: qty 0.4

## 2016-06-13 MED ORDER — HYDROMORPHONE HCL 1 MG/ML IJ SOLN
1.0000 mg | Freq: Once | INTRAMUSCULAR | Status: AC
  Administered 2016-06-13: 1 mg via INTRAVENOUS
  Filled 2016-06-13: qty 1

## 2016-06-13 MED ORDER — CLINDAMYCIN PHOSPHATE 600 MG/50ML IV SOLN
600.0000 mg | Freq: Three times a day (TID) | INTRAVENOUS | Status: DC
  Administered 2016-06-13 – 2016-06-14 (×4): 600 mg via INTRAVENOUS
  Filled 2016-06-13 (×5): qty 50

## 2016-06-13 MED ORDER — GATIFLOXACIN 0.5 % OP SOLN
1.0000 [drp] | Freq: Four times a day (QID) | OPHTHALMIC | Status: DC
  Administered 2016-06-13 – 2016-06-16 (×14): 1 [drp] via OPHTHALMIC
  Filled 2016-06-13: qty 2.5

## 2016-06-13 MED ORDER — SODIUM CHLORIDE 0.9 % IV SOLN
INTRAVENOUS | Status: AC
  Administered 2016-06-13: 04:00:00 via INTRAVENOUS

## 2016-06-13 MED ORDER — WHITE PETROLATUM GEL
Status: AC
  Filled 2016-06-13: qty 1

## 2016-06-13 MED ORDER — OXYCODONE HCL 5 MG PO TABS
5.0000 mg | ORAL_TABLET | ORAL | Status: DC | PRN
  Administered 2016-06-13 – 2016-06-16 (×14): 5 mg via ORAL
  Filled 2016-06-13 (×14): qty 1

## 2016-06-13 MED ORDER — DEXTROSE 5 % IV SOLN: 5.0000 mg/kg | Freq: Three times a day (TID) | INTRAVENOUS | Status: DC

## 2016-06-13 MED ORDER — MAGIC MOUTHWASH W/LIDOCAINE
5.0000 mL | Freq: Four times a day (QID) | ORAL | Status: DC | PRN
  Administered 2016-06-13 (×2): 5 mL via ORAL
  Filled 2016-06-13 (×3): qty 5

## 2016-06-13 MED ORDER — SUCRALFATE 1 GM/10ML PO SUSP
1.0000 g | Freq: Three times a day (TID) | ORAL | Status: DC
  Administered 2016-06-13 – 2016-06-16 (×15): 1 g via ORAL
  Filled 2016-06-13 (×15): qty 10

## 2016-06-13 MED ORDER — HYDROMORPHONE HCL 2 MG/ML IJ SOLN
0.5000 mg | INTRAMUSCULAR | Status: AC | PRN
  Administered 2016-06-13 – 2016-06-14 (×5): 0.5 mg via INTRAVENOUS
  Filled 2016-06-13 (×5): qty 1

## 2016-06-13 NOTE — Progress Notes (Signed)
06/12/2016 10:01 PM  06/13/2016 5:41 PM  Randall Romero was seen and examined.  The H&P by the admitting provider , orders, imaging was reviewed.  I spoke with opthalmologist Dr. Dione BoozeGroat who said that when patient can take p.o. Put him on valtrex 500 TID and have him to follow up in office for slit eye exam.  I also placed consult to ENT.   Please see orders.  Will continue to follow.   Maryln Manuel. Johnson, MD [facility]  [phone]

## 2016-06-13 NOTE — Progress Notes (Addendum)
Pharmacy Antibiotic Note  Randall DawleyJoseph M Davie is a 25 y.o. 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 Noted that pt with issues swallowing - hasn't taken any po meds since came to ED due to pain. Pharmacy has been consulted for Acyclovir dosing.  Plan: Acyclovir 370mg  (5mg /kg) IV q8h Will f/u micro data, renal function, and pt's clinical condition Change to oral as soon as pt can tolerate  Height: 5\' 11"  (180.3 cm) Weight: 164 lb 2 oz (74.4 kg) IBW/kg (Calculated) : 75.3  Temp (24hrs), Avg:99.4 F (37.4 C), Min:97.5 F (36.4 C), Max:103.1 F (39.5 C)   Recent Labs Lab 06/12/16 2213 06/12/16 2220  WBC 9.1  --   CREATININE 0.93  --   LATICACIDVEN  --  0.97    Estimated Creatinine Clearance: 130 mL/min (by C-G formula based on SCr of 0.93 mg/dL).    No Known Allergies  Antimicrobials this admission: 12/17 Clinda x 1 12/18 Acyclovir >>   Dose adjustments this admission: n/a  Microbiology results: 12/17 BCx x2:  12/17 Rapid strep: negative   Thank you for allowing pharmacy to be a part of this patient's care.  Christoper Fabianaron Miaya Lafontant, PharmD, BCPS Clinical pharmacist, pager [phone] 06/13/2016 3:12 AM

## 2016-06-13 NOTE — Progress Notes (Signed)
Patient states that Dr Pollyann Kennedyrosen put in an order for Dilaudid. [facility], Provider is in surgery, OR says provider not there yet. Called cell number and left message.  12:57 Not able to get a hold of dr Pollyann Kennedyrosen. At fathers request I text paged attending.  Casper HarrisonSamantha K Judeth Gilles, RN

## 2016-06-13 NOTE — Care Management Note (Signed)
Case Management Note  Patient Details  Name: Annamarie DawleyJoseph M Sher MRN: [id number] Date of Birth: 04/27/1991  Subjective/Objective:                 Patient in obs for pharyngitis, poor PO. Student, independent PTA.    Action/Plan:  CM will continue to follow for DC planning.   Expected Discharge Date:                  Expected Discharge Plan:  Home/Self Care  In-House Referral:     Discharge planning Services  CM Consult  Post Acute Care Choice:    Choice offered to:     DME Arranged:    DME Agency:     HH Arranged:    HH Agency:     Status of Service:  In process, will continue to follow  If discussed at Long Length of Stay Meetings, dates discussed:    Additional Comments:  Lawerance SabalDebbie Kenya Shiraishi, RN 06/13/2016, 4:14 PM

## 2016-06-13 NOTE — H&P (Signed)
History and Physical    Randall DawleyJoseph M Swim ZOX:[id number] DOB: [birth year] DOA: 06/12/2016   PCP: No PCP Per Patient Chief Complaint:  Chief Complaint  Patient presents with  . Fever    HPI: Randall Romero is a 25 y.o. male who [facility]  gradual onset, gradual worsening sore throat that started one week ago. He states associated fever (tmax 103.1) and chills. He expresses increased pain with swallowing and is unable to keep fluids down. [facility] [place] [facility] he was swabbed for thrush in which the results came back negative. He was further started on prednisone and given a mouthwash which did not give him any relief. [facility] for the same symptoms. He received oxycodone and diagnosed with an oral aphthous ulcer. According to pt's mother drank yogurt and mashed potatoes earlier today. Pt reports that he took ibuprofen (800 mg) x4 at 2000 tonight without relief. Mother denies diarrhea or abdominal pain.  ED Course: Patient also has R eye blurry vision and irritation and erythema to conjunctiva.  This has been ongoing since Wed.  Review of Systems: As per HPI otherwise 10 point review of systems negative.    History reviewed. No pertinent past medical history.  Past Surgical History:  Procedure Laterality Date  . ADENOIDECTOMY    . ANTERIOR CRUCIATE LIGAMENT REPAIR Right      reports that he has never smoked. He has never used smokeless tobacco. He reports that he does not drink alcohol or use drugs.  No Known Allergies  History reviewed. No pertinent family history.    Prior to Admission medications   Medication Sig Start Date End Date Taking? Authorizing Provider  [facility](FIRST-MOUTHWASH BLM) SUSP Use as directed in the mouth or throat.    Historical Provider, MD  ibuprofen (ADVIL,MOTRIN) 200 MG tablet Take 600 mg by mouth once.    Historical Provider, MD  magic mouthwash  w/lidocaine SOLN Take 5 mLs by mouth 4 (four) times daily as needed for mouth pain. 06/11/16   Derwood KaplanAnkit Nanavati, MD  oxyCODONE (ROXICODONE) 5 MG/5ML solution Take 5 mLs (5 mg total) by mouth every 4 (four) hours as needed for severe pain. 06/08/16   Melene Planan Floyd, DO  predniSONE (DELTASONE) 20 MG tablet Take 20 mg by mouth.    Historical Provider, MD  Pseudoephedrine-APAP-DM (DAYQUIL PO) Take by mouth.    Historical Provider, MD  sucralfate (CARAFATE) 1 GM/10ML suspension Take 10 mLs (1 g total) by mouth 4 (four) times daily -  with meals and at bedtime. 06/11/16   Derwood KaplanAnkit Nanavati, MD    Physical Exam: Vitals:   06/13/16 0115 06/13/16 0130 06/13/16 0131 06/13/16 0228  BP: 118/83 118/79  117/81  Pulse: 83 76  73  Resp: 17 19  18   Temp:   97.5 F (36.4 C) 99 F (37.2 C)  TempSrc:   Oral Oral  SpO2: 98% 99%  99%  Weight:      Height:          Constitutional: NAD, calm, comfortable Eyes: PERRL, conjunctivitis of the R eye, I am unable to do an exam with staining for HSV keratitis. ENMT: Oral ulcers, excoriation of lips, dry mucous membranes, posterior erythema of oropharynx. Neck: normal, supple, no masses, no thyromegaly Respiratory: clear to auscultation bilaterally, no wheezing, no crackles. Normal respiratory effort. No accessory muscle use.  Cardiovascular: Regular rate and rhythm, no murmurs / rubs /  gallops. No extremity edema. 2+ pedal pulses. No carotid bruits.  Abdomen: no tenderness, no masses palpated. No hepatosplenomegaly. Bowel sounds positive.  Musculoskeletal: no clubbing / cyanosis. No joint deformity upper and lower extremities. Good ROM, no contractures. Normal muscle tone.  Skin: no rashes, lesions, ulcers. No induration Neurologic: CN 2-12 grossly intact. Sensation intact, DTR normal. Strength 5/5 in all 4.  Psychiatric: Normal judgment and insight. Alert and oriented x 3. Normal mood.    Labs on Admission: I have personally reviewed following labs and imaging  studies  CBC:  Recent Labs Lab 06/12/16 2213  WBC 9.1  NEUTROABS 6.0  HGB 15.0  HCT 43.1  MCV 89.0  PLT 330   Basic Metabolic Panel:  Recent Labs Lab 06/12/16 2213  NA 140  K 3.8  CL 103  CO2 26  GLUCOSE 96  BUN 19  CREATININE 0.93  CALCIUM 9.2   GFR: Estimated Creatinine Clearance: 130 mL/min (by C-G formula based on SCr of 0.93 mg/dL). Liver Function Tests:  Recent Labs Lab 06/12/16 2213  AST 23  ALT 35  ALKPHOS 35*  BILITOT 0.5  PROT 7.6  ALBUMIN 3.8   No results for input(s): LIPASE, AMYLASE in the last 168 hours. No results for input(s): AMMONIA in the last 168 hours. Coagulation Profile: No results for input(s): INR, PROTIME in the last 168 hours. Cardiac Enzymes: No results for input(s): CKTOTAL, CKMB, CKMBINDEX, TROPONINI in the last 168 hours. BNP (last 3 results) No results for input(s): PROBNP in the last 8760 hours. HbA1C: No results for input(s): HGBA1C in the last 72 hours. CBG: No results for input(s): GLUCAP in the last 168 hours. Lipid Profile: No results for input(s): CHOL, HDL, LDLCALC, TRIG, CHOLHDL, LDLDIRECT in the last 72 hours. Thyroid Function Tests: No results for input(s): TSH, T4TOTAL, FREET4, T3FREE, THYROIDAB in the last 72 hours. Anemia Panel: No results for input(s): VITAMINB12, FOLATE, FERRITIN, TIBC, IRON, RETICCTPCT in the last 72 hours. Urine analysis: No results found for: COLORURINE, APPEARANCEUR, LABSPEC, PHURINE, GLUCOSEU, HGBUR, BILIRUBINUR, KETONESUR, PROTEINUR, UROBILINOGEN, NITRITE, LEUKOCYTESUR Sepsis Labs: @LABRCNTIP (procalcitonin:4,lacticidven:4) ) Recent Results (from the past 240 hour(s))  Rapid strep screen     Status: None   Collection Time: 06/12/16 10:29 PM  Result Value Ref Range Status   Streptococcus, Group A Screen (Direct) NEGATIVE NEGATIVE Final    Comment: (NOTE) A Rapid Antigen test may result negative if the antigen level in the sample is below the detection level of this test. The  FDA has not cleared this test as a stand-alone test therefore the rapid antigen negative result has reflexed to a Group A Strep culture.      Radiological Exams on Admission: Ct Soft Tissue Neck W Contrast  Result Date: 06/12/2016 CLINICAL DATA:  Gradual onset sore throat for 1 week. Fever, difficulty swallowing. Evaluate pharyngitis. History of adenoidectomy. EXAM: CT NECK WITH CONTRAST TECHNIQUE: Multidetector CT imaging of the neck was performed using the standard protocol following the bolus administration of intravenous contrast. CONTRAST:  100mL ISOVUE-300 IOPAMIDOL (ISOVUE-300) INJECTION 61% COMPARISON:  None. FINDINGS: Pharynx and larynx: Trace retropharyngeal effusion. Mildly edematous hypopharynx, normal larynx. Airway is widely patent. Salivary glands: Normal. Thyroid: Normal. Lymph nodes: 11 mm short access level IIa lymph node, additional smaller scattered lymph nodes. Vascular: 2 vessel aortic arch is a normal variant. Limited intracranial: Normal. Visualized orbits: Normal. Mastoids and visualized paranasal sinuses: Trace mucosal thickening without paranasal sinus air-fluid levels. Mastoid air cells are well aerated. Skeleton: Straightened cervical lordosis. No destructive  bony lesions. Upper chest: Trace bronchial wall thickening in the included lung apices associated with bronchitis and reactive airway disease. Other: None. IMPRESSION: Mild pharyngitis and trace retropharyngeal effusion. Borderline reactive lymphadenopathy. Electronically Signed   By: Awilda Metro M.D.   On: 06/12/2016 23:48    EKG: Independently reviewed.  Assessment/Plan Principal Problem:   Pharyngitis Active Problems:   Blurry vision    1. Pharyngitis - 1. 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 Empiric clindamycin for bacterial infection 3. Empiric acyclovir for herpes stomatitis, especially since concern now exists for possible keratitis (see below) 4. HSV-1 PCR ordered  on oral swab (RN calling down to lab to find out how to do this). 5. Rapid strep negative, culture pending 2. Blurry vision and conjunctivitis - 1. Empiric gatifloxacin eye drops for now 2. Given blurry vision, need to rule out HSV keratitis 3. Call Opthomology in AM for eye exam to rule this in or out 4. Until then, I have discussed with pharmacy and patient getting put on empiric acyclovir.   DVT prophylaxis: Lovenox Code Status: Full Family Communication: Mother at bedside Consults called: None Admission status: Admit to obs   GARDNER, Heywood Iles DO [facility] Pager [phone] from 7PM-7AM  If 7AM-7PM, please contact the day physician for the patient [email or web address] Password [email or web address]  06/13/2016, 4:10 AM

## 2016-06-13 NOTE — Consult Note (Signed)
Reason for Consult: Laryngitis, oral stomatitis Referring Physician: Murlean Iba, MD  Randall Romero is an 25 y.o. male.  HPI: Previously healthy, last Monday developed intense sore throat and blisters in his mouth. 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 He went to the clinic at the school a couple of times. He has not been on antibiotics recently. The week prior to this, he was having upper respiratory symptoms. No prior history of oral or pharyngeal problems. He does not smoke.  History reviewed. No pertinent past medical history.  Past Surgical History:  Procedure Laterality Date  . ADENOIDECTOMY    . ANTERIOR CRUCIATE LIGAMENT REPAIR Right     History reviewed. No pertinent family history.  Social History:  reports that he has never smoked. He has never used smokeless tobacco. He reports that he does not drink alcohol or use drugs.  Allergies: No Known Allergies  Medications: Reviewed  Results for orders placed or performed during the hospital encounter of 06/12/16 (from the past 48 hour(s))  CBC with Differential     Status: Abnormal   Collection Time: 06/12/16 10:13 PM  Result Value Ref Range   WBC 9.1 4.0 - 10.5 K/uL   RBC 4.84 4.22 - 5.81 MIL/uL   Hemoglobin 15.0 13.0 - 17.0 g/dL   HCT 43.1 39.0 - 52.0 %   MCV 89.0 78.0 - 100.0 fL   MCH 31.0 26.0 - 34.0 pg   MCHC 34.8 30.0 - 36.0 g/dL   RDW 11.6 11.5 - 15.5 %   Platelets 330 150 - 400 K/uL   Neutrophils Relative % 66 %   Neutro Abs 6.0 1.7 - 7.7 K/uL   Lymphocytes Relative 20 %   Lymphs Abs 1.9 0.7 - 4.0 K/uL   Monocytes Relative 12 %   Monocytes Absolute 1.1 (H) 0.1 - 1.0 K/uL   Eosinophils Relative 2 %   Eosinophils Absolute 0.2 0.0 - 0.7 K/uL   Basophils Relative 0 %   Basophils Absolute 0.0 0.0 - 0.1 K/uL  Comprehensive metabolic panel     Status: Abnormal   Collection Time: 06/12/16 10:13 PM  Result Value Ref Range   Sodium 140 135 - 145 mmol/L   Potassium 3.8 3.5 - 5.1 mmol/L   Chloride 103  101 - 111 mmol/L   CO2 26 22 - 32 mmol/L   Glucose, Bld 96 65 - 99 mg/dL   BUN 19 6 - 20 mg/dL   Creatinine, Ser 0.93 0.61 - 1.24 mg/dL   Calcium 9.2 8.9 - 10.3 mg/dL   Total Protein 7.6 6.5 - 8.1 g/dL   Albumin 3.8 3.5 - 5.0 g/dL   AST 23 15 - 41 U/L   ALT 35 17 - 63 U/L   Alkaline Phosphatase 35 (L) 38 - 126 U/L   Total Bilirubin 0.5 0.3 - 1.2 mg/dL   GFR calc non Af Amer >60 >60 mL/min   GFR calc Af Amer >60 >60 mL/min    Comment: (NOTE) The eGFR has been calculated using the CKD EPI equation. This calculation has not been validated in all clinical situations. eGFR's persistently <60 mL/min signify possible Chronic Kidney Disease.    Anion gap 11 5 - 15  Mononucleosis screen     Status: None   Collection Time: 06/12/16 10:13 PM  Result Value Ref Range   Mono Screen NEGATIVE NEGATIVE  I-Stat CG4 Lactic Acid, ED  (not at  [facility])     Status: None   Collection Time: 06/12/16  10:20 PM  Result Value Ref Range   Lactic Acid, Venous 0.97 0.5 - 1.9 mmol/L  Rapid strep screen     Status: None   Collection Time: 06/12/16 10:29 PM  Result Value Ref Range   Streptococcus, Group A Screen (Direct) NEGATIVE NEGATIVE    Comment: (NOTE) A Rapid Antigen test may result negative if the antigen level in the sample is below the detection level of this test. The FDA has not cleared this test as a stand-alone test therefore the rapid antigen negative result has reflexed to a Group A Strep culture.     Ct Soft Tissue Neck W Contrast  Result Date: 06/12/2016 CLINICAL DATA:  Gradual onset sore throat for 1 week. Fever, difficulty swallowing. Evaluate pharyngitis. History of adenoidectomy. EXAM: CT NECK WITH CONTRAST TECHNIQUE: Multidetector CT imaging of the neck was performed using the standard protocol following the bolus administration of intravenous contrast. CONTRAST:  158m ISOVUE-300 IOPAMIDOL (ISOVUE-300) INJECTION 61% COMPARISON:  None. FINDINGS: Pharynx and larynx: Trace retropharyngeal  effusion. Mildly edematous hypopharynx, normal larynx. Airway is widely patent. Salivary glands: Normal. Thyroid: Normal. Lymph nodes: 11 mm short access level IIa lymph node, additional smaller scattered lymph nodes. Vascular: 2 vessel aortic arch is a normal variant. Limited intracranial: Normal. Visualized orbits: Normal. Mastoids and visualized paranasal sinuses: Trace mucosal thickening without paranasal sinus air-fluid levels. Mastoid air cells are well aerated. Skeleton: Straightened cervical lordosis. No destructive bony lesions. Upper chest: Trace bronchial wall thickening in the included lung apices associated with bronchitis and reactive airway disease. Other: None. IMPRESSION: Mild pharyngitis and trace retropharyngeal effusion. Borderline reactive lymphadenopathy. Electronically Signed   By: CElon AlasM.D.   On: 06/12/2016 23:48    RNGE:[id number] as listed in admit H&P  Blood pressure 117/71, pulse 66, temperature 98.8 F (37.1 C), temperature source Oral, resp. rate 18, height _0  (1.803 m), weight 74.4 kg (164 lb 2 oz), SpO2 100 %.  PHYSICAL EXAM: Overall appearance:  Healthy appearing, in no distress, no difficulty breathing, able to talk. Head:  Normocephalic, atraumatic. Ears: External look healthy. Nose: External nose is healthy in appearance. Internal nasal exam free of any lesions or obstruction. Oral Cavity/Pharynx:  Multiple hemorrhagic sores in the oral cavity and pharynx. Larynx/Hypopharynx: Deferred Neuro:  No identifiable neurologic deficits. Neck: No palpable neck masses, or adenopathy.  Studies Reviewed: none  Procedures: none   Assessment/Plan: Viral stomatitis/pharyngitis. Continue supportive care. He should start to plateau and improve in the next couple of days. No other testing or treatment necessary.  Vickie Ponds 06/13/2016, 12:01 PM

## 2016-06-14 DIAGNOSIS — B9789 Other viral agents as the cause of diseases classified elsewhere: Secondary | ICD-10-CM | POA: Diagnosis present

## 2016-06-14 DIAGNOSIS — B309 Viral conjunctivitis, unspecified: Secondary | ICD-10-CM | POA: Diagnosis present

## 2016-06-14 DIAGNOSIS — K121 Other forms of stomatitis: Secondary | ICD-10-CM

## 2016-06-14 MED ORDER — HYDROMORPHONE HCL 2 MG/ML IJ SOLN: 0.5000 mg | INTRAMUSCULAR | Status: DC | PRN

## 2016-06-14 MED ORDER — SENNOSIDES-DOCUSATE SODIUM 8.6-50 MG PO TABS
1.0000 | ORAL_TABLET | Freq: Two times a day (BID) | ORAL | Status: DC
  Administered 2016-06-15 – 2016-06-16 (×2): 1 via ORAL
  Filled 2016-06-14 (×4): qty 1

## 2016-06-14 MED ORDER — ACYCLOVIR 200 MG PO CAPS
200.0000 mg | ORAL_CAPSULE | Freq: Every day | ORAL | Status: DC
  Administered 2016-06-14 – 2016-06-16 (×11): 200 mg via ORAL
  Filled 2016-06-14 (×13): qty 1

## 2016-06-14 NOTE — Progress Notes (Signed)
PROGRESS NOTE    Randall DawleyJoseph M Romero  BJY:[id number]  DOB: 06-14-1991  DOA: 06/12/2016 PCP: No PCP Per Patient Outpatient Specialists:  Hospital course: Randall Romero is a 25 y.o. male who [facility] with gradual onset, gradual worseningsore throat that started one week ago. He states associated fever (tmax 103.1) and chills. He expresses increased pain with swallowing and is unable to keep fluids down.[facility] where hewas swabbed for thrush in which the results came back negative. He was further started on prednisone and given a mouthwash which did not give him any relief. [facility]/16 for the same symptoms. He received oxycodone and diagnosed with an oral aphthous ulcer. According to pt's mother drank yogurt and mashed potatoes earlier today. Pt reports that he took ibuprofen (800 mg) x4 at 2000 without relief. Mother denies diarrhea or abdominal pain.  Assessment & Plan:   Viral Stomatitis - severe case, but clinically improving, awaiting HSV testing, continue acyclovir for now.  Continue supportive therapy today and encourage oral eating and drinking.  Appreciate ENT consult and recommendations.  Hopefully can go home tomorrow.    Conjunctivitis right eye - Highly suspect this is viral, discussed with opthalmologist, will send home on oral valtrex 500 TID and they will see him in office for slit eye exam after discharge.  I informed patient and his mother at bedside.  They verbalized understanding.     Code Status: full DVT Prophylaxis: enoxaparin Family Communication: bedside Disposition Plan: home 12/20 if continue to improve   Consultants:  ENT  Opthalmology (by telephone)  Subjective: Pt says he is starting to feel better and noticing improvement, starting to eat and drink this morning.   Objective: Vitals:   06/13/16 1457 06/13/16 1732 06/13/16 2054 06/14/16 0646  BP: 125/71  137/80  139/76  Pulse: 80  86 85  Resp: 20  17 17   Temp:  99.1 F (37.3 C) (!) 100.5 F (38.1 C) 98.6 F (37 C)  TempSrc:  Oral Oral Oral  SpO2:   100% 98%  Weight:      Height:        Intake/Output Summary (Last 24 hours) at 06/14/16 0855 Last data filed at 06/14/16 0645  Gross per 24 hour  Intake          3245.54 ml  Output             2000 ml  Net          1245.54 ml   Filed Weights   06/12/16 2153 06/12/16 2235  Weight: 77.6 kg (171 lb) 74.4 kg (164 lb 2 oz)    Exam:  Overall appearance:  Healthy appearing, in no distress, no difficulty breathing, able to talk. Head:  Normocephalic, atraumatic. Ears: External look healthy. Nose: External nose is healthy in appearance. Internal nasal exam free of any lesions or obstruction. Oral Cavity/Pharynx:  Multiple hemorrhagic sores in the oral cavity and pharynx. Larynx/Hypopharynx: Deferred Neuro:  No identifiable neurologic deficits. Neck: No palpable neck masses, or adenopathy. Central nervous system: Alert and oriented. No focal neurological deficits. Extremities: no CCE.  Data Reviewed: Basic Metabolic Panel:  Recent Labs Lab 06/12/16 2213  NA 140  K 3.8  CL 103  CO2 26  GLUCOSE 96  BUN 19  CREATININE 0.93  CALCIUM 9.2   Liver Function Tests:  Recent Labs Lab 06/12/16 2213  AST 23  ALT 35  ALKPHOS 35*  BILITOT 0.5  PROT 7.6  ALBUMIN 3.8   No results for input(s): LIPASE, AMYLASE in the last 168 hours. No results for input(s): AMMONIA in the last 168 hours. CBC:  Recent Labs Lab 06/12/16 2213  WBC 9.1  NEUTROABS 6.0  HGB 15.0  HCT 43.1  MCV 89.0  PLT 330   Cardiac Enzymes: No results for input(s): CKTOTAL, CKMB, CKMBINDEX, TROPONINI in the last 168 hours. CBG (last 3)  No results for input(s): GLUCAP in the last 72 hours. Recent Results (from the past 240 hour(s))  Rapid strep screen     Status: None   Collection Time: 06/12/16 10:29 PM  Result Value Ref Range Status   Streptococcus, Group A  Screen (Direct) NEGATIVE NEGATIVE Final    Comment: (NOTE) A Rapid Antigen test may result negative if the antigen level in the sample is below the detection level of this test. The FDA has not cleared this test as a stand-alone test therefore the rapid antigen negative result has reflexed to a Group A Strep culture.     Studies: Ct Soft Tissue Neck W Contrast  Result Date: 06/12/2016 CLINICAL DATA:  Gradual onset sore throat for 1 week. Fever, difficulty swallowing. Evaluate pharyngitis. History of adenoidectomy. EXAM: CT NECK WITH CONTRAST TECHNIQUE: Multidetector CT imaging of the neck was performed using the standard protocol following the bolus administration of intravenous contrast. CONTRAST:  100mL ISOVUE-300 IOPAMIDOL (ISOVUE-300) INJECTION 61% COMPARISON:  None. FINDINGS: Pharynx and larynx: Trace retropharyngeal effusion. Mildly edematous hypopharynx, normal larynx. Airway is widely patent. Salivary glands: Normal. Thyroid: Normal. Lymph nodes: 11 mm short access level IIa lymph node, additional smaller scattered lymph nodes. Vascular: 2 vessel aortic arch is a normal variant. Limited intracranial: Normal. Visualized orbits: Normal. Mastoids and visualized paranasal sinuses: Trace mucosal thickening without paranasal sinus air-fluid levels. Mastoid air cells are well aerated. Skeleton: Straightened cervical lordosis. No destructive bony lesions. Upper chest: Trace bronchial wall thickening in the included lung apices associated with bronchitis and reactive airway disease. Other: None. IMPRESSION: Mild pharyngitis and trace retropharyngeal effusion. Borderline reactive lymphadenopathy. Electronically Signed   By: Awilda Metroourtnay  Bloomer M.D.   On: 06/12/2016 23:48   Scheduled Meds: . acyclovir  5 mg/kg Intravenous Q8H  . docusate  200 mg Oral BID  . enoxaparin (LOVENOX) injection  40 mg Subcutaneous Q24H  . gatifloxacin  1 drop Both Eyes QID  . sucralfate  1 g Oral TID WC & HS  . white  petrolatum       Continuous Infusions: . sodium chloride 125 mL/hr at 06/14/16 [id number]    Principal Problem:   Pharyngitis Active Problems:   Blurry vision  Time spent:   Standley Dakinslanford Johnson, MD [facility] Pager [phone]  If 7PM-7AM, please contact night-coverage [email or web address] Password [email or web address] 06/14/2016, 8:55 AM    LOS: 1 day

## 2016-06-15 DIAGNOSIS — B309 Viral conjunctivitis, unspecified: Secondary | ICD-10-CM

## 2016-06-15 LAB — BASIC METABOLIC PANEL
ANION GAP: 10 (ref 5–15)
BUN: 5 mg/dL — ABNORMAL LOW (ref 6–20)
CALCIUM: 8.9 mg/dL (ref 8.9–10.3)
CHLORIDE: 102 mmol/L (ref 101–111)
CO2: 25 mmol/L (ref 22–32)
Creatinine, Ser: 0.87 mg/dL (ref 0.61–1.24)
GFR calc non Af Amer: >60 mL/min (ref >60)
Glucose, Bld: 81 mg/dL (ref 65–99)
Potassium: 3.7 mmol/L (ref 3.5–5.1)
SODIUM: 137 mmol/L (ref 135–145)

## 2016-06-15 LAB — CULTURE, GROUP A STREP (THRC)

## 2016-06-15 LAB — HIV ANTIBODY (ROUTINE TESTING W REFLEX): HIV SCREEN 4TH GENERATION: NONREACTIVE

## 2016-06-15 LAB — CBC
HEMATOCRIT: 39.2 % (ref 39.0–52.0)
HEMOGLOBIN: 13.6 g/dL (ref 13.0–17.0)
MCH: 30.4 pg (ref 26.0–34.0)
MCHC: 34.7 g/dL (ref 30.0–36.0)
MCV: 87.7 fL (ref 78.0–100.0)
Platelets: 289 10*3/uL (ref 150–400)
RBC: 4.47 MIL/uL (ref 4.22–5.81)
RDW: 11.4 % — ABNORMAL LOW (ref 11.5–15.5)
WBC: 5.3 10*3/uL (ref 4.0–10.5)

## 2016-06-15 NOTE — Progress Notes (Signed)
PROGRESS NOTE    Randall Romero  ZOX:[id number]  DOB: 24-Jun-1991  DOA: 06/12/2016 PCP: No PCP Per Patient Outpatient Specialists:  Hospital course: Randall DawleyJoseph M Mccraw is a 25 y.o. male who [facility] with gradual onset, gradual worseningsore throat that started one week ago. He states associated fever (tmax 103.1) and chills. He expresses increased pain with swallowing and is unable to keep fluids down.[facility] for thrush in which the results came back negative. He was further started on prednisone and given a mouthwash which did not give him any relief. [facility]/16 for the same symptoms. He received oxycodone and diagnosed with an oral aphthous ulcer. According to pt's mother drank yogurt and mashed potatoes earlier today. Pt reports that he took ibuprofen (800 mg) x4 at 2000 without relief. Mother denies diarrhea or abdominal pain.  Assessment & Plan:   Viral Stomatitis - severe case, but clinically improving as he is starting to eat and drink now, awaiting HSV testing, continue acyclovir for now.  Continue supportive therapy today and encourage oral eating and drinking.  Appreciate ENT consult and recommendations.  Family wants to see Dr. Pollyann Kennedyosen again.  Maybe can go home tomorrow.  Trying to cut down on pain meds, encouraged oral magic mouthwash and encouraged ambulation and still waiting on HSV testing, called [facility] and they said that maybe it will result tonight and we will have the results tomorrow.      Conjunctivitis right eye - Highly suspect this is viral, discussed with opthalmologist, will send home on oral valtrex 500 TID and they will see him in office for slit eye exam after discharge.  I informed patient and his mother at bedside.  They verbalized understanding.    Code Status: full DVT Prophylaxis: enoxaparin Family Communication: bedside Disposition Plan: home 12/20 if  continue to improve  Consultants:  ENT  Opthalmology (by telephone)  Subjective: Pt says he is starting to feel better and noticing improvement, starting to eat and drink this morning.   Objective: Vitals:   06/14/16 0646 06/14/16 1516 06/14/16 2105 06/15/16 0455  BP: 139/76 (!) 141/79 (!) 141/75 139/76  Pulse: 85 86 93 80  Resp: 17 20 20 20   Temp: 98.6 F (37 C)  97.3 F (36.3 C) 98.9 F (37.2 C)  TempSrc: Oral  Oral Oral  SpO2: 98% 100% 99% 100%  Weight:      Height:        Intake/Output Summary (Last 24 hours) at 06/15/16 1318 Last data filed at 06/15/16 1258  Gross per 24 hour  Intake          3227.09 ml  Output             2550 ml  Net           677.09 ml   Filed Weights   06/12/16 2153 06/12/16 2235  Weight: 77.6 kg (171 lb) 74.4 kg (164 lb 2 oz)    Exam:  Overall appearance:  Healthy appearing, in no distress, no difficulty breathing, able to talk. Head:  Normocephalic, atraumatic. Ears: External look healthy. Nose: External nose is healthy in appearance. Internal nasal exam free of any lesions or obstruction. Oral Cavity/Pharynx:  Multiple hemorrhagic sores in the oral cavity and pharynx, so change from prior exam. Larynx/Hypopharynx: Deferred Neuro:  No identifiable neurologic deficits. Neck: No palpable neck masses, or adenopathy. Central nervous system:  Alert and oriented. No focal neurological deficits. Extremities: no CCE.  Data Reviewed: Basic Metabolic Panel:  Recent Labs Lab 06/12/16 2213 06/15/16 0605  NA 140 137  K 3.8 3.7  CL 103 102  CO2 26 25  GLUCOSE 96 81  BUN 19 5*  CREATININE 0.93 0.87  CALCIUM 9.2 8.9   Liver Function Tests:  Recent Labs Lab 06/12/16 2213  AST 23  ALT 35  ALKPHOS 35*  BILITOT 0.5  PROT 7.6  ALBUMIN 3.8   No results for input(s): LIPASE, AMYLASE in the last 168 hours. No results for input(s): AMMONIA in the last 168 hours. CBC:  Recent Labs Lab 06/12/16 2213 06/15/16 0605  WBC 9.1 5.3    NEUTROABS 6.0  --   HGB 15.0 13.6  HCT 43.1 39.2  MCV 89.0 87.7  PLT 330 289   Cardiac Enzymes: No results for input(s): CKTOTAL, CKMB, CKMBINDEX, TROPONINI in the last 168 hours. CBG (last 3)  No results for input(s): GLUCAP in the last 72 hours. Recent Results (from the past 240 hour(s))  Blood Culture (routine x 2)     Status: None (Preliminary result)   Collection Time: 06/12/16 10:13 PM  Result Value Ref Range Status   Specimen Description BLOOD RIGHT ARM  Final   Special Requests BOTTLES DRAWN AEROBIC AND ANAEROBIC 5ML EACH  Final   Culture   Final    NO GROWTH 2 DAYS Performed at [facility]    Report Status PENDING  Incomplete  Rapid strep screen     Status: None   Collection Time: 06/12/16 10:29 PM  Result Value Ref Range Status   Streptococcus, Group A Screen (Direct) NEGATIVE NEGATIVE Final    Comment: (NOTE) A Rapid Antigen test may result negative if the antigen level in the sample is below the detection level of this test. The FDA has not cleared this test as a stand-alone test therefore the rapid antigen negative result has reflexed to a Group A Strep culture.   Culture, group A strep     Status: None   Collection Time: 06/12/16 10:29 PM  Result Value Ref Range Status   Specimen Description THROAT  Final   Special Requests NONE Reflexed from [id number]  Final   Culture   Final    NO GROUP A STREP (S.PYOGENES) ISOLATED Performed at [facility]    Report Status 06/15/2016 FINAL  Final  Blood Culture (routine x 2)     Status: None (Preliminary result)   Collection Time: 06/12/16 10:30 PM  Result Value Ref Range Status   Specimen Description BLOOD LEFT WRIST  Final   Special Requests BOTTLES DRAWN AEROBIC AND ANAEROBIC 5CC EACH  Final   Culture   Final    NO GROWTH 2 DAYS Performed at [facility]    Report Status PENDING  Incomplete    Studies: No results found. Scheduled Meds: . acyclovir  200 mg Oral 5 X Daily  . enoxaparin  (LOVENOX) injection  40 mg Subcutaneous Q24H  . gatifloxacin  1 drop Both Eyes QID  . senna-docusate  1 tablet Oral BID  . sucralfate  1 g Oral TID WC & HS   Continuous Infusions:  Principal Problem:   Viral stomatitis Active Problems:   Pharyngitis   Blurry vision   Viral conjunctivitis of right eye  Time spent:   Standley Dakinslanford Dawid Dupriest, MD [facility] Pager [phone]  If 7PM-7AM, please contact night-coverage [email or web address] Password [email or web address] 06/15/2016, 1:18 PM  LOS: 2 days

## 2016-06-16 MED ORDER — SENNOSIDES-DOCUSATE SODIUM 8.6-50 MG PO TABS: 1.0000 | ORAL_TABLET | Freq: Two times a day (BID) | ORAL | 0 refills | Status: AC | PRN

## 2016-06-16 MED ORDER — OXYCODONE HCL 5 MG PO TABS: 5.0000 mg | ORAL_TABLET | Freq: Four times a day (QID) | ORAL | 0 refills | Status: AC | PRN

## 2016-06-16 MED ORDER — VALACYCLOVIR HCL 500 MG PO TABS: 500.0000 mg | ORAL_TABLET | Freq: Three times a day (TID) | ORAL | 0 refills | Status: AC

## 2016-06-16 NOTE — Discharge Summary (Signed)
Physician Discharge Summary  Annamarie DawleyJoseph M Romero QIH:[id number] DOB: 01/18/1991 DOA: 06/12/2016  PCP: No PCP Per Patient  Admit date: 06/12/2016 Discharge date: 06/16/2016  Admitted From: Home  Disposition:  Home  Recommendations for Outpatient Follow-up:  1. Follow up with ENT Dr Pollyann Kennedyosen next week 2. Follow up with opthalmology in 1 week 3. Please follow up on the following pending results: HSV cultures  Discharge Condition: STABLE CODE STATUS: FULL   Brief/Interim Summary: Hospital course: Randall BonusJoseph M Pantusois a 25 y.o.malewho [facility] with gradual onset, gradual worseningsore throat that started one week ago. He states associated fever (tmax 103.1) and chills. He expresses increased pain with swallowing and is unable to keep fluids down.[facility] he[facility] sw[facility] for thrush in which the results came back negative. He was further started on prednisone and given a mouthwash which did not give him any relief. [facility]16 for the same symptoms. He received oxycodone and diagnosed with an oral aphthous ulcer. According to pt's mother drank yogurt and mashed potatoes earlier today. Pt reports that he took ibuprofen (800 mg) x4 at 2000 without relief. Mother denies diarrhea or abdominal pain.  Assessment & Plan:   Viral Stomatitis - severe case, but clinically improving as he is starting to eat and drink now, awaiting HSV testing, home on oral valtrex for 1 week.  Continue to encourage oral eating and drinking.  Appreciate ENT consult and recommendations.  Family wants to see Dr. Pollyann Kennedyosen next week on follow up.  Discharge home today.   encouraged oral magic mouthwash and encouraged ambulation.       Conjunctivitis right eye - Highly suspect this is viral, discussed with opthalmologist, will send home on oral valtrex 500 TID and they will see him in office for slit eye exam after discharge.  I informed patient and  his mother at bedside.  They verbalized understanding.    Code Status: full DVT Prophylaxis: enoxaparin Family Communication: bedside Disposition Plan: home   Consultants:  ENT (rosen)  Opthalmology (by telephone)  Discharge Diagnoses:  Principal Problem:   Viral stomatitis Active Problems:   Pharyngitis   Blurry vision   Viral conjunctivitis of right eye  Discharge Instructions  Discharge Instructions    Increase activity slowly    Complete by:  As directed      Allergies as of 06/16/2016   No Known Allergies     Medication List    STOP taking these medications   ibuprofen 200 MG tablet Commonly known as:  ADVIL,MOTRIN   oxyCODONE 5 MG/5ML solution Commonly known as:  ROXICODONE Replaced by:  oxyCODONE 5 MG immediate release tablet   sucralfate 1 GM/10ML suspension Commonly known as:  CARAFATE     TAKE these medications   magic mouthwash w/lidocaine Soln Take 5 mLs by mouth 4 (four) times daily as needed for mouth pain.   oxyCODONE 5 MG immediate release tablet Commonly known as:  Oxy IR/ROXICODONE Take 1 tablet (5 mg total) by mouth every 6 (six) hours as needed for severe pain. Replaces:  oxyCODONE 5 MG/5ML solution   senna-docusate 8.6-50 MG tablet Commonly known as:  Senokot-S Take 1 tablet by mouth 2 (two) times daily as needed for mild constipation.   valACYclovir 500 MG tablet Commonly known as:  VALTREX Take 1 tablet (500 mg total) by mouth 3 (three) times daily.      Follow-up Information  Fabian Sharp, MD. Schedule an appointment as soon as possible for a visit in 1 week(s).   Specialty:  Ophthalmology Why:  Hospital Follow Up  Contact information: [address] [place] [address] [phone]        Serena Colonel, MD. Schedule an appointment as soon as possible for a visit in 1 week(s).   Specialty:  Otolaryngology Why:  Hospital Follow Up  Contact information: [address] [place]  [address] [phone]          No Known Allergies  Procedures/Studies: Ct Soft Tissue Neck W Contrast  Result Date: 06/12/2016 CLINICAL DATA:  Gradual onset sore throat for 1 week. Fever, difficulty swallowing. Evaluate pharyngitis. History of adenoidectomy. EXAM: CT NECK WITH CONTRAST TECHNIQUE: Multidetector CT imaging of the neck was performed using the standard protocol following the bolus administration of intravenous contrast. CONTRAST:  ISOVUE-300 IOPAMIDOL (ISOVUE-300) INJECTION 61% COMPARISON:  None. FINDINGS: Pharynx and larynx: Trace retropharyngeal effusion. Mildly edematous hypopharynx, normal larynx. Airway is widely patent. Salivary glands: Normal. Thyroid: Normal. Lymph nodes: 11 mm short access level IIa lymph node, additional smaller scattered lymph nodes. Vascular: 2 vessel aortic arch is a normal variant. Limited intracranial: Normal. Visualized orbits: Normal. Mastoids and visualized paranasal sinuses: Trace mucosal thickening without paranasal sinus air-fluid levels. Mastoid air cells are well aerated. Skeleton: Straightened cervical lordosis. No destructive bony lesions. Upper chest: Trace bronchial wall thickening in the included lung apices associated with bronchitis and reactive airway disease. Other: None. IMPRESSION: Mild pharyngitis and trace retropharyngeal effusion. Borderline reactive lymphadenopathy. Electronically Signed   By: Awilda Metro M.D.   On: 06/12/2016 23:48   Subjective: Pt says overall that he is improving.  He is eating and drinking better.    Discharge Exam: Vitals:   06/15/16 2105 06/16/16 0541  BP: 140/71 133/80  Pulse: 99 69  Resp: 16 20  Temp: 100.1 F (37.8 C) 98.2 F (36.8 C)   Vitals:   06/14/16 2105 06/15/16 0455 06/15/16 2105 06/16/16 0541  BP: (!) 141/75 139/76 140/71 133/80  Pulse: 93 80 99 69  Resp: 20 20 16 20   Temp: 97.3 F (36.3 C) 98.9 F (37.2 C) 100.1 F (37.8 C) 98.2 F (36.8 C)  TempSrc: Oral Oral Oral  Oral  SpO2: 99% 100% 99% 100%  Weight:      Height:       General: Pt is alert, awake, not in acute distress ENT: diffuse mucositis but overall less edema and improved from prior exam Cardiovascular: RRR, S1/S2 +, no rubs, no gallops Respiratory: CTA bilaterally, no wheezing, no rhonchi Abdominal: Soft, NT, ND, bowel sounds + Extremities: no edema, no cyanosis  The results of significant diagnostics from this hospitalization (including imaging, microbiology, ancillary and laboratory) are listed below for reference.     Microbiology: Recent Results (from the past 240 hour(s))  Blood Culture (routine x 2)     Status: None (Preliminary result)   Collection Time: 06/12/16 10:13 PM  Result Value Ref Range Status   Specimen Description BLOOD RIGHT ARM  Final   Special Requests BOTTLES DRAWN AEROBIC AND ANAEROBIC EACH  Final   Culture   Final    NO GROWTH 2 DAYS Performed at [facility]    Report Status PENDING  Incomplete  Rapid strep screen     Status: None   Collection Time: 06/12/16 10:29 PM  Result Value Ref Range Status   Streptococcus, Group A Screen (Direct) NEGATIVE  NEGATIVE Final    Comment: (NOTE) A Rapid Antigen test may result negative if the antigen level in the sample is below the detection level of this test. The FDA has not cleared this test as a stand-alone test therefore the rapid antigen negative result has reflexed to a Group A Strep culture.   Culture, group A strep     Status: None   Collection Time: 06/12/16 10:29 PM  Result Value Ref Range Status   Specimen Description THROAT  Final   Special Requests NONE Reflexed from [id number]  Final   Culture   Final    NO GROUP A STREP (S.PYOGENES) ISOLATED Performed at [facility]    Report Status 06/15/2016 FINAL  Final  Blood Culture (routine x 2)     Status: None (Preliminary result)   Collection Time: 06/12/16 10:30 PM  Result Value Ref Range Status   Specimen Description BLOOD LEFT WRIST   Final   Special Requests BOTTLES DRAWN AEROBIC AND ANAEROBIC 5CC EACH  Final   Culture   Final    NO GROWTH 2 DAYS Performed at [facility]    Report Status PENDING  Incomplete  Hsv Culture And Typing     Status: None   Collection Time: 06/13/16 10:29 AM  Result Value Ref Range Status   HSV Culture/Type Comment  Final    Comment: (NOTE) This virus culture is contaminated with bacteria and/or fungi. The specimen will be retreated with antibiotics and cell culture will be repeated. Culture report to follow. Performed At: [facility] [address] [place], [place] [id number] Mila HomerHancock William F MD BJ:[phone]    Source of Sample ORAL  Final    Comment:  OROPHARYNX     Labs: BNP (last 3 results) No results for input(s): BNP in the last 8760 hours. Basic Metabolic Panel:  Recent Labs Lab 06/12/16 2213 06/15/16 0605  NA 140 137  K 3.8 3.7  CL 103 102  CO2 26 25  GLUCOSE 96 81  BUN 19 5*  CREATININE 0.93 0.87  CALCIUM 9.2 8.9   Liver Function Tests:  Recent Labs Lab 06/12/16 2213  AST 23  ALT 35  ALKPHOS 35*  BILITOT 0.5  PROT 7.6  ALBUMIN 3.8   No results for input(s): LIPASE, AMYLASE in the last 168 hours. No results for input(s): AMMONIA in the last 168 hours. CBC:  Recent Labs Lab 06/12/16 2213 06/15/16 0605  WBC 9.1 5.3  NEUTROABS 6.0  --   HGB 15.0 13.6  HCT 43.1 39.2  MCV 89.0 87.7  PLT 330 289   Cardiac Enzymes: No results for input(s): CKTOTAL, CKMB, CKMBINDEX, TROPONINI in the last 168 hours. BNP: Invalid input(s): POCBNP CBG: No results for input(s): GLUCAP in the last 168 hours. D-Dimer No results for input(s): DDIMER in the last 72 hours. Hgb A1c No results for input(s): HGBA1C in the last 72 hours. Lipid Profile No results for input(s): CHOL, HDL, LDLCALC, TRIG, CHOLHDL, LDLDIRECT in the last 72 hours. Thyroid function studies No results for input(s): TSH, T4TOTAL, T3FREE, THYROIDAB in the last 72  hours.  Invalid input(s): FREET3 Anemia work up No results for input(s): VITAMINB12, FOLATE, FERRITIN, TIBC, IRON, RETICCTPCT in the last 72 hours. Urinalysis No results found for: COLORURINE, APPEARANCEUR, LABSPEC, PHURINE, GLUCOSEU, HGBUR, BILIRUBINUR, KETONESUR, PROTEINUR, UROBILINOGEN, NITRITE, LEUKOCYTESUR Sepsis Labs Invalid input(s): PROCALCITONIN,  WBC,  LACTICIDVEN Microbiology Recent Results (from the past 240 hour(s))  Blood Culture (routine x 2)     Status: None (  Preliminary result)   Collection Time: 06/12/16 10:13 PM  Result Value Ref Range Status   Specimen Description BLOOD RIGHT ARM  Final   Special Requests BOTTLES DRAWN AEROBIC AND ANAEROBIC EACH  Final   Culture   Final    NO GROWTH 2 DAYS Performed at [facility]    Report Status PENDING  Incomplete  Rapid strep screen     Status: None   Collection Time: 06/12/16 10:29 PM  Result Value Ref Range Status   Streptococcus, Group A Screen (Direct) NEGATIVE NEGATIVE Final    Comment: (NOTE) A Rapid Antigen test may result negative if the antigen level in the sample is below the detection level of this test. The FDA has not cleared this test as a stand-alone test therefore the rapid antigen negative result has reflexed to a Group A Strep culture.   Culture, group A strep     Status: None   Collection Time: 06/12/16 10:29 PM  Result Value Ref Range Status   Specimen Description THROAT  Final   Special Requests NONE Reflexed from [id number]  Final   Culture   Final    NO GROUP A STREP (S.PYOGENES) ISOLATED Performed at [facility]    Report Status 06/15/2016 FINAL  Final  Blood Culture (routine x 2)     Status: None (Preliminary result)   Collection Time: 06/12/16 10:30 PM  Result Value Ref Range Status   Specimen Description BLOOD LEFT WRIST  Final   Special Requests BOTTLES DRAWN AEROBIC AND ANAEROBIC 5CC EACH  Final   Culture   Final    NO GROWTH 2 DAYS Performed at [facility]     Report Status PENDING  Incomplete  Hsv Culture And Typing     Status: None   Collection Time: 06/13/16 10:29 AM  Result Value Ref Range Status   HSV Culture/Type Comment  Final    Comment: (NOTE) This virus culture is contaminated with bacteria and/or fungi. The specimen will be retreated with antibiotics and cell culture will be repeated. Culture report to follow. Performed At: [facility] [address] [place], [place] [id number] Mila Homer MD BJ:[id number]    Source of Sample ORAL  Final    Comment:  OROPHARYNX   Time coordinating discharge: 28 minutes  SIGNED:  Standley Dakins, MD  [facility] 06/16/2016, 11:33 AM Pager   If 7PM-7AM, please contact night-coverage [email or web address] Password [email or web address]

## 2016-06-16 NOTE — Progress Notes (Signed)
Patient was discharged home by MD order; discharged instructions  review and give to patient and his mother with care notes and prescription; IV DIC; skin intact; patient refused to be escorted by staff. He left with his parents.

## 2016-06-16 NOTE — Progress Notes (Signed)
Patient ID: Annamarie DawleyJoseph M Brunetti, male   DOB: October 14, 1990, 25 y.o.   MRN: [id number]  Followup visit.  Doing better, eating and drinking some better. Being discharged today.  Exam reveals diffuse mucositis, healing from stomatitis. No adenopathy or facial swelling.  Reassurance provided, he continues to improve. He is going to go home today he is eating and drinking much better. They can follow up with me next week in the office if they need or desire.

## 2016-06-16 NOTE — Discharge Instructions (Signed)
Dehydration, Adult Dehydration is when there is not enough fluid or water in your body. This happens when you lose more fluids than you take in. Dehydration can range from mild to very bad. It should be treated right away to keep it from getting very bad. Symptoms of mild dehydration may include:   Thirst.  Dry lips.  Slightly dry mouth.  Dry, warm skin.  Dizziness. Symptoms of moderate dehydration may include:   Very dry mouth.  Muscle cramps.  Dark pee (urine). Pee may be the color of tea.  Your body making less pee.  Your eyes making fewer tears.  Heartbeat that is uneven or faster than normal (palpitations).  Headache.  Light-headedness, especially when you stand up from sitting.  Fainting (syncope). Symptoms of very bad dehydration may include:   Changes in skin, such as:  Cold and clammy skin.  Blotchy (mottled) or pale skin.  Skin that does not quickly return to normal after being lightly pinched and let go (poor skin turgor).  Changes in body fluids, such as:  Feeling very thirsty.  Your eyes making fewer tears.  Not sweating when body temperature is high, such as in hot weather.  Your body making very little pee.  Changes in vital signs, such as:  Weak pulse.  Pulse that is more than 100 beats a minute when you are sitting still.  Fast breathing.  Low blood pressure.  Other changes, such as:  Sunken eyes.  Cold hands and feet.  Confusion.  Lack of energy (lethargy).  Trouble waking up from sleep.  Short-term weight loss.  Unconsciousness. Follow these instructions at home:  If told by your doctor, drink an ORS:  Make an ORS by using instructions on the package.  Start by drinking small amounts, about  cup (120 mL) every 5-10 minutes.  Slowly drink more until you have had the amount that your doctor said to have.  Drink enough clear fluid to keep your pee clear or pale yellow. If you were told to drink an ORS, finish the  ORS first, then start slowly drinking clear fluids. Drink fluids such as:  Water. Do not drink only water by itself. Doing that can make the salt (sodium) level in your body get too low (hyponatremia).  Ice chips.  Fruit juice that you have added water to (diluted).  Low-calorie sports drinks.  Avoid:  Alcohol.  Drinks that have a lot of sugar. These include high-calorie sports drinks, fruit juice that does not have water added, and soda.  Caffeine.  Foods that are greasy or have a lot of fat or sugar.  Take over-the-counter and prescription medicines only as told by your doctor.  Do not take salt tablets. Doing that can make the salt level in your body get too high (hypernatremia).  Eat foods that have minerals (electrolytes). Examples include bananas, oranges, potatoes, tomatoes, and spinach.  Keep all follow-up visits as told by your doctor. This is important. Contact a doctor if:  You have belly (abdominal) pain that:  Gets worse.  Stays in one area (localizes).  You have a rash.  You have a stiff neck.  You get angry or annoyed more easily than normal (irritability).  You are more sleepy than normal.  You have a harder time waking up than normal.  You feel:  Weak.  Dizzy.  Very thirsty.  You have peed (urinated) only a small amount of very dark pee during 6-8 hours. Get help right away if:  You   have symptoms of very bad dehydration.  You cannot drink fluids without throwing up (vomiting).  Your symptoms get worse with treatment.  You have a fever.  You have a very bad headache.  You are throwing up or having watery poop (diarrhea) and it:  Gets worse.  Does not go away.  You have blood or something green (bile) in your throw-up.  You have blood in your poop (stool). This may cause poop to look black and tarry.  You have not peed in 6-8 hours.  You pass out (faint).  Your heart rate when you are sitting still is more than 100 beats a  minute.  You have trouble breathing. This information is not intended to replace advice given to you by your health care provider. Make sure you discuss any questions you have with your health care provider. Document Released: 04/09/2009 Document Revised: 01/01/2016 Document Reviewed: 08/07/2015 Elsevier Interactive Patient Education  2017 [employer]  

## 2016-06-17 LAB — HSV CULTURE AND TYPING

## 2016-06-18 LAB — CULTURE, BLOOD (ROUTINE X 2)
Culture: NO GROWTH
Culture: NO GROWTH

## 2019-02-03 ENCOUNTER — Other Ambulatory Visit: Payer: Self-pay

## 2019-02-03 ENCOUNTER — Emergency Department (HOSPITAL_BASED_OUTPATIENT_CLINIC_OR_DEPARTMENT_OTHER)
Admission: EM | Admit: 2019-02-03 | Discharge: 2019-02-03 | Disposition: A | Discharge status: Home / Self Care | Payer: 59 | Attending: Emergency Medicine | Admitting: Emergency Medicine

## 2019-02-03 ENCOUNTER — Encounter (HOSPITAL_BASED_OUTPATIENT_CLINIC_OR_DEPARTMENT_OTHER): Payer: Self-pay | Admitting: Emergency Medicine

## 2019-02-03 ENCOUNTER — Emergency Department (HOSPITAL_BASED_OUTPATIENT_CLINIC_OR_DEPARTMENT_OTHER): Payer: 59

## 2019-02-03 DIAGNOSIS — N200 Calculus of kidney: Secondary | ICD-10-CM

## 2019-02-03 DIAGNOSIS — N132 Hydronephrosis with renal and ureteral calculous obstruction: Secondary | ICD-10-CM | POA: Diagnosis not present

## 2019-02-03 DIAGNOSIS — Z79899 Other long term (current) drug therapy: Secondary | ICD-10-CM | POA: Diagnosis not present

## 2019-02-03 DIAGNOSIS — R1032 Left lower quadrant pain: Secondary | ICD-10-CM | POA: Diagnosis present

## 2019-02-03 DIAGNOSIS — R109 Unspecified abdominal pain: Secondary | ICD-10-CM

## 2019-02-03 LAB — URINALYSIS, ROUTINE W REFLEX MICROSCOPIC
Bilirubin Urine: NEGATIVE
Glucose, UA: NEGATIVE mg/dL
Ketones, ur: NEGATIVE mg/dL
Leukocytes,Ua: NEGATIVE
Nitrite: NEGATIVE
Protein, ur: NEGATIVE mg/dL
Specific Gravity, Urine: >1.03 — ABNORMAL HIGH (ref 1.005–1.030)
pH: 5.5 (ref 5.0–8.0)

## 2019-02-03 LAB — COMPREHENSIVE METABOLIC PANEL
ALT: 16 U/L (ref 0–44)
AST: 20 U/L (ref 15–41)
Albumin: 4.6 g/dL (ref 3.5–5.0)
Alkaline Phosphatase: 37 U/L — ABNORMAL LOW (ref 38–126)
Anion gap: 10 (ref 5–15)
BUN: 23 mg/dL — ABNORMAL HIGH (ref 6–20)
CO2: 23 mmol/L (ref 22–32)
Calcium: 9.4 mg/dL (ref 8.9–10.3)
Chloride: 103 mmol/L (ref 98–111)
Creatinine, Ser: 1.15 mg/dL (ref 0.61–1.24)
GFR calc Af Amer: >60 mL/min (ref >60)
GFR calc non Af Amer: >60 mL/min (ref >60)
Glucose, Bld: 88 mg/dL (ref 70–99)
Potassium: 4.1 mmol/L (ref 3.5–5.1)
Sodium: 136 mmol/L (ref 135–145)
Total Bilirubin: 0.6 mg/dL (ref 0.3–1.2)
Total Protein: 7.2 g/dL (ref 6.5–8.1)

## 2019-02-03 LAB — CBC WITH DIFFERENTIAL/PLATELET
Abs Immature Granulocytes: 0.02 10*3/uL (ref 0.00–0.07)
Basophils Absolute: 0.1 10*3/uL (ref 0.0–0.1)
Basophils Relative: 0 %
Eosinophils Absolute: 0.1 10*3/uL (ref 0.0–0.5)
Eosinophils Relative: 1 %
HCT: 46 % (ref 39.0–52.0)
Hemoglobin: 15.2 g/dL (ref 13.0–17.0)
Immature Granulocytes: 0 %
Lymphocytes Relative: 16 %
Lymphs Abs: 1.8 10*3/uL (ref 0.7–4.0)
MCH: 30.8 pg (ref 26.0–34.0)
MCHC: 33 g/dL (ref 30.0–36.0)
MCV: 93.1 fL (ref 80.0–100.0)
Monocytes Absolute: 0.7 10*3/uL (ref 0.1–1.0)
Monocytes Relative: 6 %
Neutro Abs: 8.8 10*3/uL — ABNORMAL HIGH (ref 1.7–7.7)
Neutrophils Relative %: 77 %
Platelets: 229 10*3/uL (ref 150–400)
RBC: 4.94 MIL/uL (ref 4.22–5.81)
RDW: 11.9 % (ref 11.5–15.5)
WBC: 11.5 10*3/uL — ABNORMAL HIGH (ref 4.0–10.5)
nRBC: 0 % (ref 0.0–0.2)

## 2019-02-03 LAB — URINALYSIS, MICROSCOPIC (REFLEX): RBC / HPF: >50 RBC/hpf (ref 0–5)

## 2019-02-03 MED ORDER — KETOROLAC TROMETHAMINE 30 MG/ML IJ SOLN
30.0000 mg | Freq: Once | INTRAMUSCULAR | Status: AC
  Administered 2019-02-03: 18:00:00 30 mg via INTRAVENOUS
  Filled 2019-02-03: qty 1

## 2019-02-03 MED ORDER — ONDANSETRON 4 MG PO TBDP: 4.0000 mg | ORAL_TABLET | Freq: Three times a day (TID) | ORAL | 0 refills | Status: AC | PRN

## 2019-02-03 MED ORDER — TAMSULOSIN HCL 0.4 MG PO CAPS: 0.4000 mg | ORAL_CAPSULE | Freq: Every day | ORAL | 0 refills | Status: AC

## 2019-02-03 MED ORDER — OXYCODONE-ACETAMINOPHEN 5-325 MG PO TABS: 2.0000 | ORAL_TABLET | ORAL | 0 refills | Status: AC | PRN

## 2019-02-03 MED ORDER — OXYCODONE-ACETAMINOPHEN 5-325 MG PO TABS
1.0000 | ORAL_TABLET | Freq: Once | ORAL | Status: AC
  Administered 2019-02-03: 1 via ORAL
  Filled 2019-02-03: qty 1

## 2019-02-03 MED ORDER — SODIUM CHLORIDE 0.9 % IV BOLUS
1000.0000 mL | Freq: Once | INTRAVENOUS | Status: AC
  Administered 2019-02-03: 1000 mL via INTRAVENOUS

## 2019-02-03 MED ORDER — ONDANSETRON HCL 4 MG/2ML IJ SOLN
4.0000 mg | Freq: Once | INTRAMUSCULAR | Status: AC
  Administered 2019-02-03: 18:00:00 4 mg via INTRAVENOUS
  Filled 2019-02-03: qty 2

## 2019-02-03 NOTE — ED Triage Notes (Signed)
L flank pain since yesterday. Endorses nausea and dysuria.

## 2019-02-03 NOTE — ED Provider Notes (Signed)
[facility] EMERGENCY DEPARTMENT Provider Note   CSN: [id number] Arrival date & time: 02/03/19  1749    History   Chief Complaint Chief Complaint  Patient presents with   Flank Pain    HPI Randall Romero is a 28 y.o. male.     28 y.o male with no PMH presents to the ED with a chief complaint of left flank pain times yesterday.  Patient reports he had a telehealth visit yesterday, was informed he likely had a urinary tract infection, was placed on antibiotics but states significant pain was noted to his left flank today.  Patient describes a sharp sensation from his left flank radiating into his abdomen, states his pain waxes and wanes, he also endorses nausea along with diaphoresis while episode occurs.  He has taken antibiotics to help with his symptoms, states he believes symptoms are worsening.  He denies any dysuria, hematuria, does report some discoloration to his urine.  He denies any penile discharge, fevers, other complaints.  No prior history of nephrolithiasis.  The history is provided by the patient.  Flank Pain Pertinent negatives include no chest pain, no abdominal pain and no shortness of breath.    History reviewed. No pertinent past medical history.  Patient Active Problem List   Diagnosis Date Noted   Viral stomatitis 06/14/2016   Viral conjunctivitis of right eye 06/14/2016   Pharyngitis 06/13/2016   Blurry vision 06/13/2016    Past Surgical History:  Procedure Laterality Date   ADENOIDECTOMY     ANTERIOR CRUCIATE LIGAMENT REPAIR Right         Home Medications    Prior to Admission medications   Medication Sig Start Date End Date Taking? Authorizing Provider  magic mouthwash w/lidocaine SOLN Take 5 mLs by mouth 4 (four) times daily as needed for mouth pain. Patient not taking: Reported on 06/13/2016 06/11/16   Derwood KaplanNanavati, Ankit, MD  ondansetron (ZOFRAN ODT) 4 MG disintegrating tablet Take 1 tablet (4 mg total) by mouth every 8  (eight) hours as needed for up to 7 days for nausea or vomiting. 02/03/19 02/10/19  Claude MangesSoto, Adrinne Sze, PA-C  oxyCODONE (OXY IR/ROXICODONE) 5 MG immediate release tablet Take 1 tablet (5 mg total) by mouth every 6 (six) hours as needed for severe pain. 06/16/16   Johnson, Clanford L, MD  oxyCODONE-acetaminophen (PERCOCET/ROXICET) 5-325 MG tablet Take 2 tablets by mouth every 4 (four) hours as needed for up to 3 days for severe pain. 02/03/19 02/06/19  Claude MangesSoto, Dara Camargo, PA-C  senna-docusate (SENOKOT-S) 8.6-50 MG tablet Take 1 tablet by mouth 2 (two) times daily as needed for mild constipation. 06/16/16   Johnson, Clanford L, MD  tamsulosin (FLOMAX) 0.4 MG CAPS capsule Take 1 capsule (0.4 mg total) by mouth daily for 7 days. 02/03/19 02/10/19  Claude MangesSoto, Coriana Angello, PA-C    Family History No family history on file.  Social History Social History   Tobacco Use   Smoking status: Never Smoker   Smokeless tobacco: Never Used  Substance Use Topics   Alcohol use: Yes    Comment: social   Drug use: No     Allergies   Patient has no known allergies.   Review of Systems Review of Systems  Constitutional: Negative for chills and fever.  HENT: Negative for ear pain and sore throat.   Eyes: Negative for pain and visual disturbance.  Respiratory: Negative for cough and shortness of breath.   Cardiovascular: Negative for chest pain and palpitations.  Gastrointestinal: Negative for abdominal pain  and vomiting.  Genitourinary: Positive for flank pain. Negative for discharge, dysuria, hematuria, penile pain, penile swelling, scrotal swelling and testicular pain.  Musculoskeletal: Negative for arthralgias and back pain.  Skin: Negative for color change and rash.  Neurological: Negative for seizures and syncope.  All other systems reviewed and are negative.    Physical Exam Updated Vital Signs BP 135/82    Pulse (!) 54    Temp 97.6 F (36.4 C) (Oral)    Resp 18    SpO2 100%   Physical Exam Vitals signs and  nursing note reviewed.  Constitutional:      Appearance: He is well-developed.     Comments: Non-ill-appearing.  HENT:     Head: Normocephalic and atraumatic.  Eyes:     General: No scleral icterus.    Pupils: Pupils are equal, round, and reactive to light.  Neck:     Musculoskeletal: Normal range of motion.  Cardiovascular:     Heart sounds: Normal heart sounds.  Pulmonary:     Effort: Pulmonary effort is normal.     Breath sounds: Normal breath sounds. No wheezing.  Chest:     Chest wall: No tenderness.  Abdominal:     General: Bowel sounds are normal. There is no distension.     Palpations: Abdomen is soft.     Tenderness: There is no abdominal tenderness. There is left CVA tenderness. There is no right CVA tenderness, guarding or rebound.     Comments: Bowel sounds present, significant left CVA.  Musculoskeletal:        General: No tenderness or deformity.  Skin:    General: Skin is warm and dry.  Neurological:     Mental Status: He is alert and oriented to person, place, and time.      ED Treatments / Results  Labs (all labs ordered are listed, but only abnormal results are displayed) Labs Reviewed  URINALYSIS, ROUTINE W REFLEX MICROSCOPIC - Abnormal; Notable for the following components:      Result Value   Color, Urine AMBER (*)    APPearance CLOUDY (*)    Specific Gravity, Urine >1.030 (*)    Hgb urine dipstick LARGE (*)    All other components within normal limits  CBC WITH DIFFERENTIAL/PLATELET - Abnormal; Notable for the following components:   WBC 11.5 (*)    Neutro Abs 8.8 (*)    All other components within normal limits  COMPREHENSIVE METABOLIC PANEL - Abnormal; Notable for the following components:   BUN 23 (*)    Alkaline Phosphatase 37 (*)    All other components within normal limits  URINALYSIS, MICROSCOPIC (REFLEX) - Abnormal; Notable for the following components:   Bacteria, UA MANY (*)    All other components within normal limits     EKG None  Radiology Ct Renal Stone Study  Result Date: 02/03/2019 CLINICAL DATA:  Left flank pain EXAM: CT ABDOMEN AND PELVIS WITHOUT CONTRAST TECHNIQUE: Multidetector CT imaging of the abdomen and pelvis was performed following the standard protocol without IV contrast. COMPARISON:  None. FINDINGS: Lower chest: The lung bases are clear. The heart size is normal. Hepatobiliary: The liver is normal. Normal gallbladder.There is no biliary ductal dilation. Pancreas: Normal contours without ductal dilatation. No peripancreatic fluid collection. Spleen: No splenic laceration or hematoma. Adrenals/Urinary Tract: --Adrenal glands: No adrenal hemorrhage. --Right kidney/ureter: No hydronephrosis or perinephric hematoma. --Left kidney/ureter: There are multiple punctate nonobstructing stones in the left kidney. There is mild left-sided hydroureteronephrosis it appears to be  secondary to an obstructing 3 mm stone in the distal left ureter. --Urinary bladder: Unremarkable. Stomach/Bowel: --Stomach/Duodenum: No hiatal hernia or other gastric abnormality. Normal duodenal course and caliber. --Small bowel: No dilatation or inflammation. --Colon: No focal abnormality. --Appendix: Normal. Vascular/Lymphatic: Normal course and caliber of the major abdominal vessels. --No retroperitoneal lymphadenopathy. --No mesenteric lymphadenopathy. --No pelvic or inguinal lymphadenopathy. Reproductive: Unremarkable Other: No ascites or free air. The abdominal wall is normal. Musculoskeletal. 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 There is no displaced fracture. IMPRESSION: 1. Mild left-sided hydroureteronephrosis secondary to an obstructing 3 mm stone in the distal left ureter. 2. Additional punctate nonobstructing stones are noted in the left kidney. Electronically Signed   By: Katherine Mantlehristopher  Green M.D.   On: 02/03/2019 19:36    Procedures Procedures (including critical care  time)  Medications Ordered in ED Medications  ondansetron (ZOFRAN) injection 4 mg (4 mg Intravenous Given 02/03/19 1821)  ketorolac (TORADOL) 30 MG/ML injection 30 mg (30 mg Intravenous Given 02/03/19 1821)  sodium chloride 0.9 % bolus 1,000 mL (0 mLs Intravenous Stopped 02/03/19 2009)  oxyCODONE-acetaminophen (PERCOCET/ROXICET) 5-325 MG per tablet 1 tablet (1 tablet Oral Given 02/03/19 2010)     Initial Impression / Assessment and Plan / ED Course  I have reviewed the triage vital signs and the nursing notes.  Pertinent labs & imaging results that were available during my care of the patient were reviewed by me and considered in my medical decision making (see chart for details).    Patient with no pertinent past medical history presents to the ED with complaints of left flank pain which began yesterday, had a telehealth visit and was told he likely had a UTI, was given a prescription for some antibiotics states he took 1 dose and has not had improvement in symptoms.  Patient arrived in the ED and discomfort states when the sharp pain to his left flank began as he begins to get nauseated along with diaphoretic.  During primary evaluation patient has significant tenderness along the left flank.  Is nontoxic-appearing, afebrile during visit.  Labs reveal a CMP with no electrolyte abnormalities, LFTs are within normal limits.  CBC shows slight leukocytosis at 11.5, hemoglobin is within normal limits, rest of his panel was normal.  UA showed many bacteria, 0-5 white blood cell count, large hemoglobin.  No nitrites, leukocytes, white blood cell count.  A CT renal study was ordered to rule out any infected stone.  CT renal study revealed: 1. Mild left-sided hydroureteronephrosis secondary to an obstructing  3 mm stone in the distal left ureter.  2. Additional punctate nonobstructing stones are noted in the left  kidney.     These results were discussed with patient, a call to nephrology was  placed.  Spoke to resident Dr. Nikki DomSalamone, appreciate his help.  He reported patient would need to go home on some tamsulosin, and follow-up outpatient was appropriate.  Patient was reassessed by me pain is under control, will switch him to Percocet oral in the ED, also send him home with a short course of Percocet along with tamsulosin and Zofran for his symptoms.  He is encouraged to follow-up with urology on outpatient basis.  Patient discharged from the ED in stable condition, afebrile without further complaints.  Return precautions discussed at length.   Portions of this note were generated with Scientist, clinical (histocompatibility and immunogenetics)Dragon dictation software. Dictation errors may occur despite best attempts at proofreading.  Final Clinical Impressions(s) /  ED Diagnoses   Final diagnoses:  Flank pain  Nephrolithiasis    ED Discharge Orders         Ordered    oxyCODONE-acetaminophen (PERCOCET/ROXICET) 5-325 MG tablet  Every 4 hours PRN    Note to Pharmacy: Unable to print narcotics, please accept printed form.   02/03/19 2055    tamsulosin (FLOMAX) 0.4 MG CAPS capsule  Daily     02/03/19 2056    ondansetron (ZOFRAN ODT) 4 MG disintegrating tablet  Every 8 hours PRN     02/03/19 2056           Claude MangesSoto, Francetta Ilg, PA-C 02/03/19 2101    Terrilee FilesButler, Michael C, MD 02/04/19 1539

## 2019-02-03 NOTE — Discharge Instructions (Addendum)
I have prescribed medication to help with your pain, please see prescription attached.  I have also provided tamsulosin, this medication will help with stone expulsion, please take this as directed.  A prescription for Zofran was also given to help with your nausea.  The phone number to urology is attached to your chart, please schedule an appointment to follow-up in office in the upcoming week.  If you experience any fever, vomiting, worsening symptoms please return to the emergency department.

## 2019-02-03 NOTE — ED Notes (Signed)
Patient transported to CT 

## 2021-01-22 ENCOUNTER — Ambulatory Visit: Payer: Self-pay

## 2021-08-09 IMAGING — CT CT RENAL STONE PROTOCOL
2 of 4 series · 16 of 46 positions shown, 18 images · non-contrast
Comparison: None.

CLINICAL DATA: Left flank pain

EXAM:
CT ABDOMEN AND PELVIS WITHOUT CONTRAST
TECHNIQUE: Multidetector CT imaging of the abdomen and pelvis was performed
following the standard protocol without IV contrast.

[Series 2: axial st · axial · 0.82mm/px · z∈[-576,-76]mm · 13 of 110 slices shown, 15 images]
[im 5/110  soft-tissue]
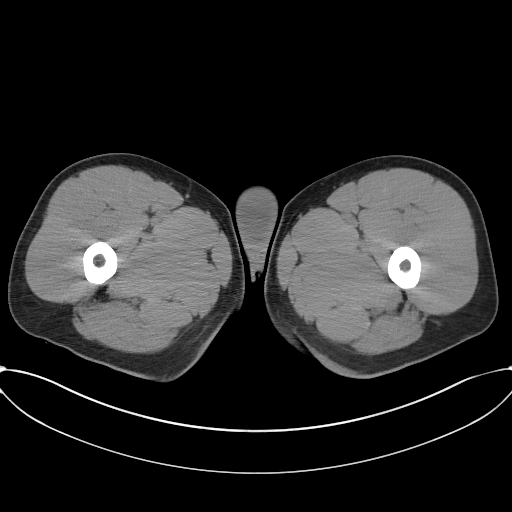
[im 5/110  bone]
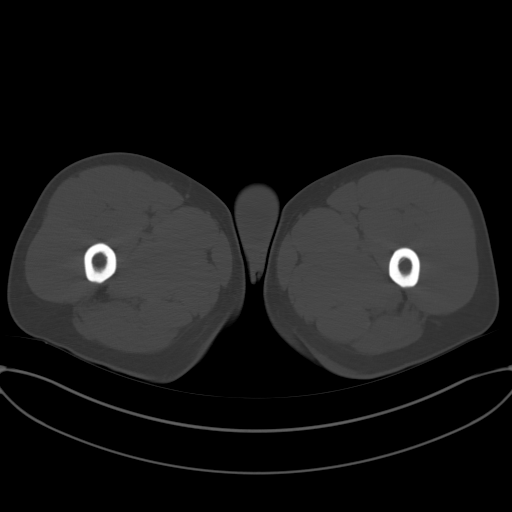
[im 14/110  soft-tissue]
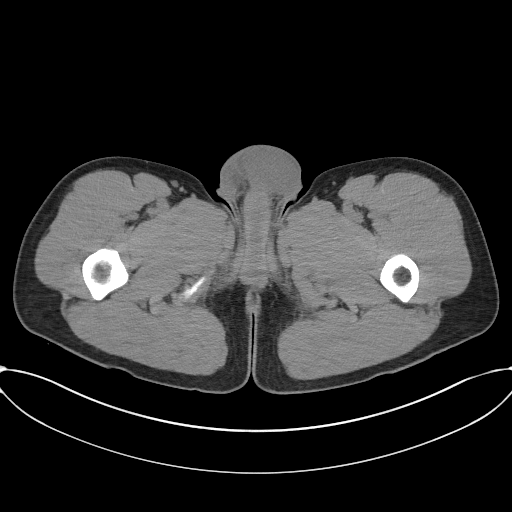
[im 23/110  soft-tissue]
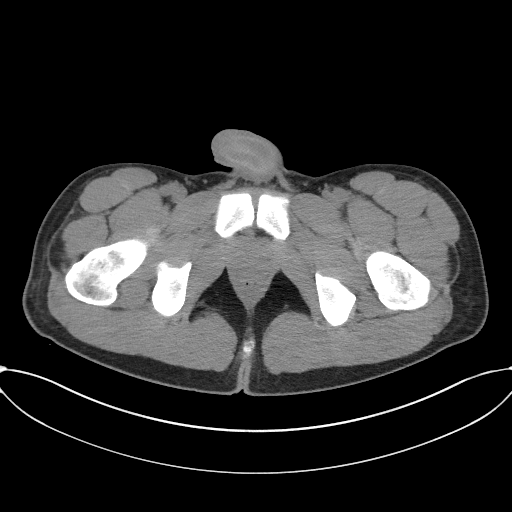
[im 32/110  soft-tissue]
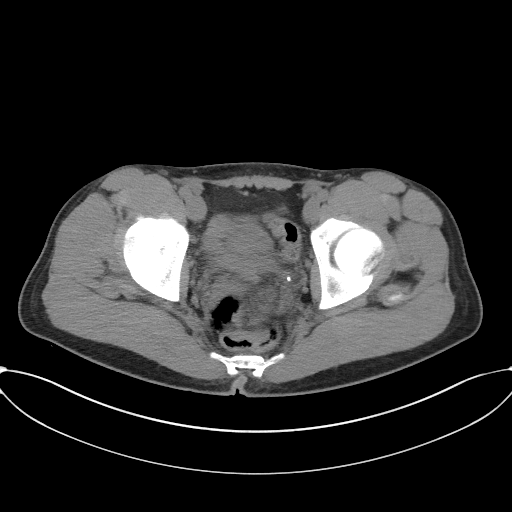
[im 37/110  soft-tissue]
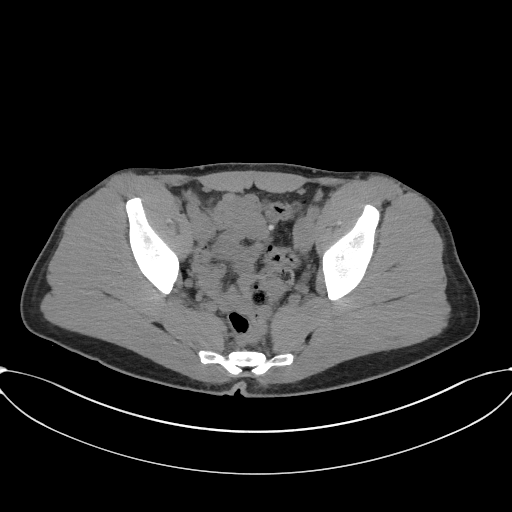
[im 46/110  soft-tissue]
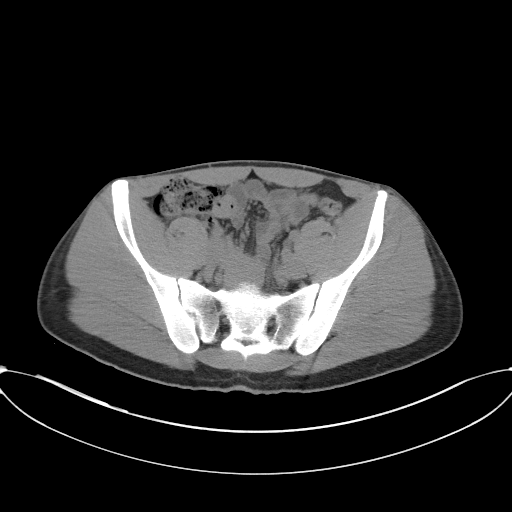
[im 55/110  soft-tissue]
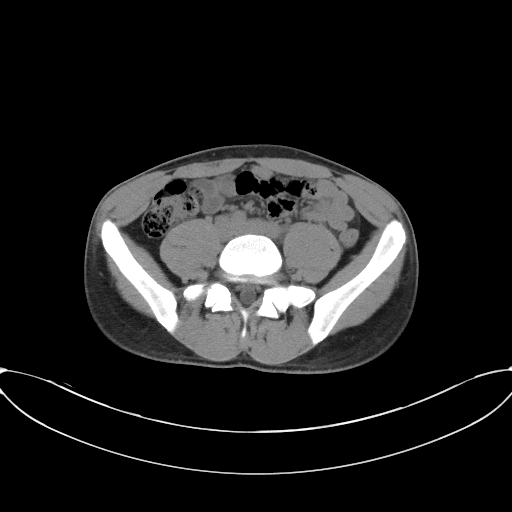
[im 64/110  soft-tissue]
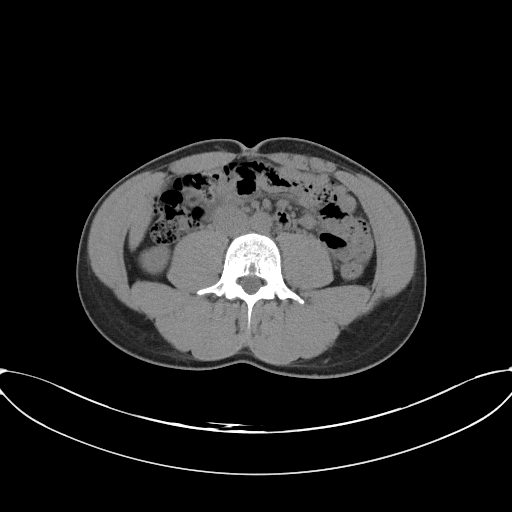
[im 73/110  soft-tissue]
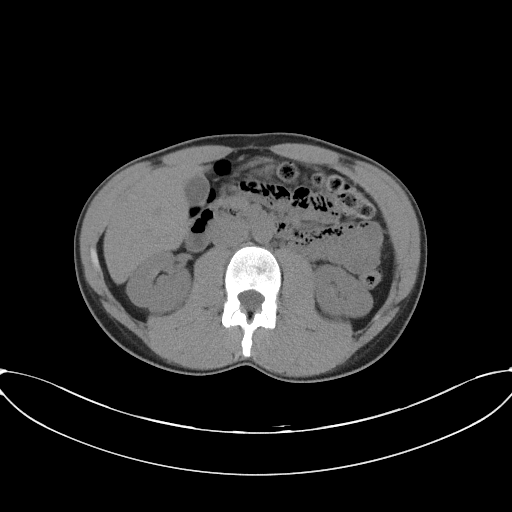
[im 73/110  bone]
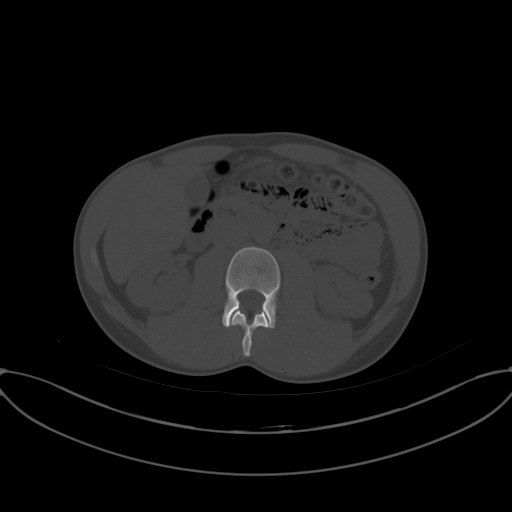
[im 78/110  soft-tissue]
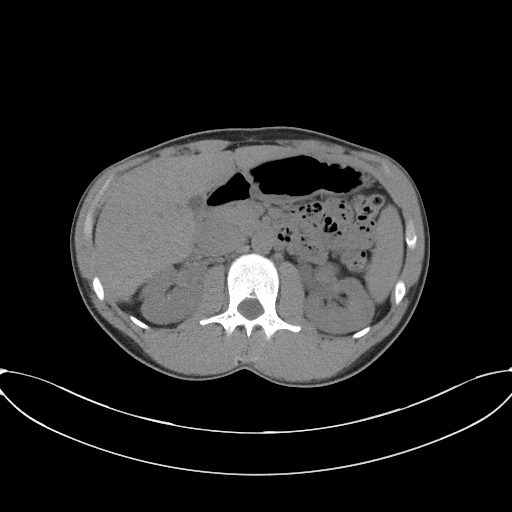
[im 87/110  soft-tissue]
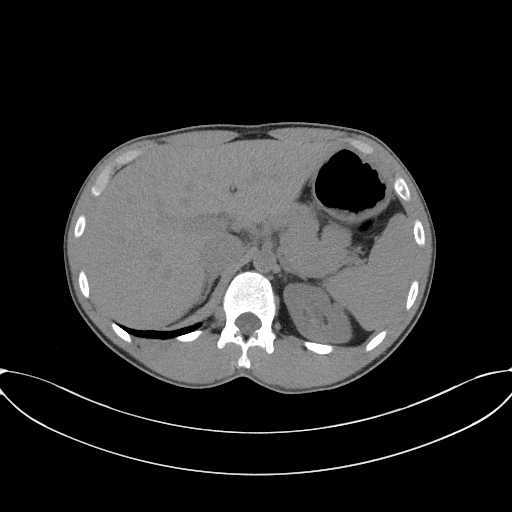
[im 96/110  soft-tissue]
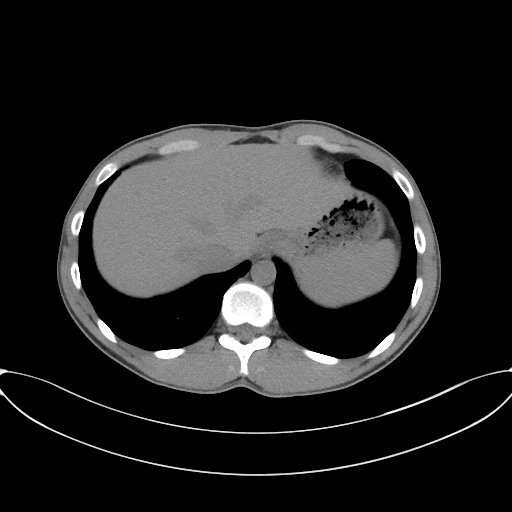
[im 105/110  soft-tissue]
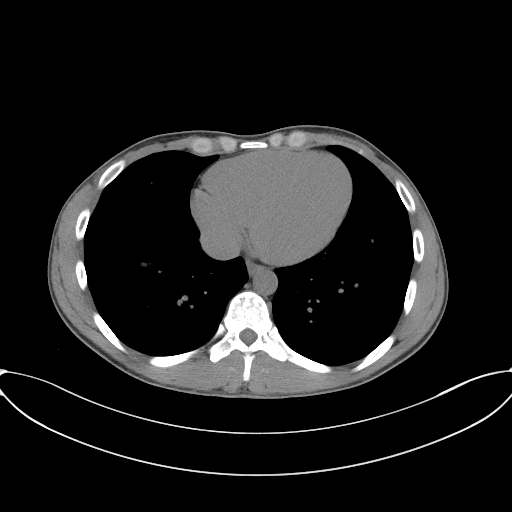

[Series 5: coronal st · coronal · 0.79mm/px · 3 of 72 slices shown]
[im 24/72  soft-tissue]
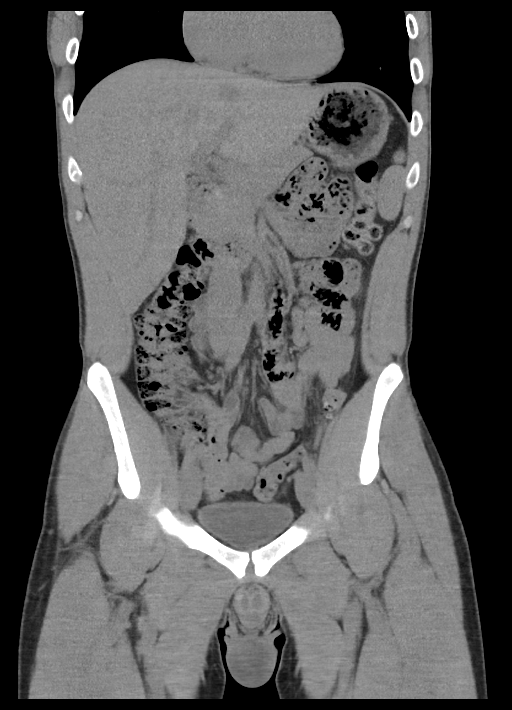
[im 32/72  soft-tissue]
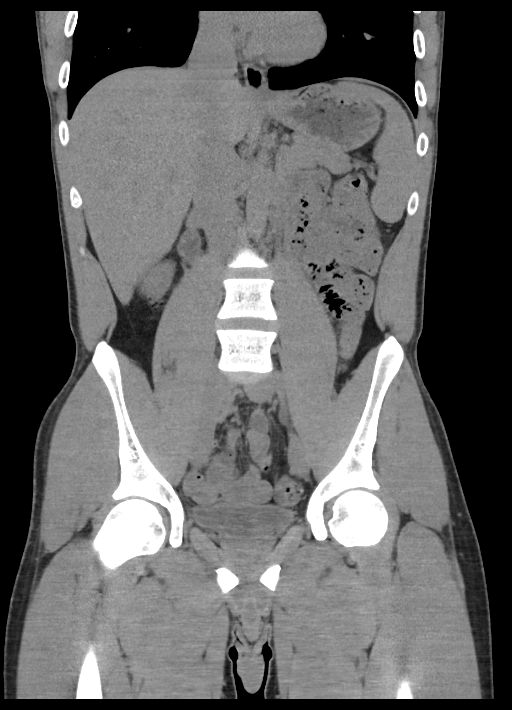
[im 40/72  soft-tissue]
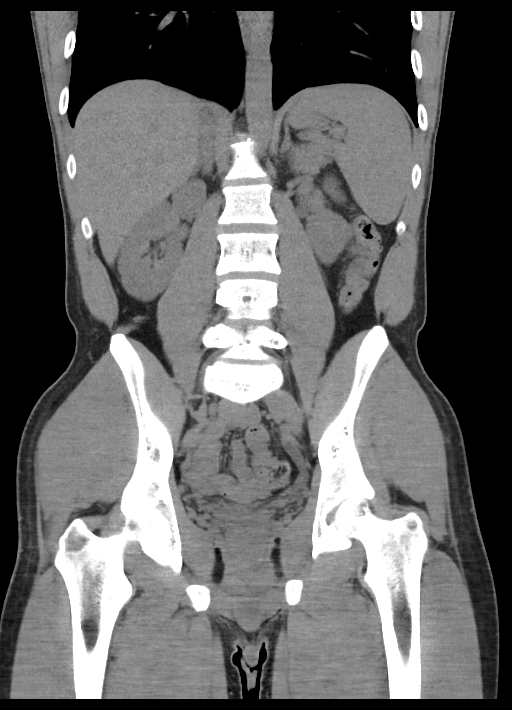

[16 of 46 positions shown; findings below may reference images not displayed]

FINDINGS: Lower chest: The lung bases are clear. The heart size is normal.

Hepatobiliary: The liver is normal. Normal gallbladder.There is no
biliary ductal dilation.

Pancreas: Normal contours without ductal dilatation. No
peripancreatic fluid collection.

Spleen: No splenic laceration or hematoma.

Adrenals/Urinary Tract:

--Adrenal glands: No adrenal hemorrhage.

--Right kidney/ureter: No hydronephrosis or perinephric hematoma.

--Left kidney/ureter: There are multiple punctate nonobstructing
stones in the left kidney. There is mild left-sided
hydroureteronephrosis it appears to be secondary to an obstructing 3
mm stone in the distal left ureter.

--Urinary bladder: Unremarkable.

Stomach/Bowel:

--Stomach/Duodenum: No hiatal hernia or other gastric abnormality.
Normal duodenal course and caliber.

--Small bowel: No dilatation or inflammation.

--Colon: No focal abnormality.

--Appendix: Normal.

Vascular/Lymphatic: Normal course and caliber of the major abdominal
vessels.

--No retroperitoneal lymphadenopathy.

--No mesenteric lymphadenopathy.

--No pelvic or inguinal lymphadenopathy.

Reproductive: Unremarkable

Other: No ascites or free air. The abdominal wall is normal.

Musculoskeletal. There is a unilateral pars defect at L5 resulting
in grade 1 anterolisthesis of L5 on S1. There is no displaced
fracture.
IMPRESSION: 1. Mild left-sided hydroureteronephrosis secondary to an obstructing
3 mm stone in the distal left ureter.
2. Additional punctate nonobstructing stones are noted in the left
kidney.
